# Patient Record
Sex: Male | Born: 1955 | Race: White | Hispanic: No | Marital: Single | Smoking: Never smoker
Health system: Southern US, Community
[De-identification: ages and names within clinical notes are randomized; demographics above are authoritative.]

## PROBLEM LIST (undated history)

## (undated) DIAGNOSIS — M1990 Unspecified osteoarthritis, unspecified site: Secondary | ICD-10-CM

## (undated) DIAGNOSIS — F209 Schizophrenia, unspecified: Secondary | ICD-10-CM

## (undated) DIAGNOSIS — E785 Hyperlipidemia, unspecified: Secondary | ICD-10-CM

## (undated) DIAGNOSIS — I10 Essential (primary) hypertension: Secondary | ICD-10-CM

## (undated) HISTORY — DX: Schizophrenia, unspecified: F20.9

## (undated) HISTORY — DX: Hyperlipidemia, unspecified: E78.5

## (undated) HISTORY — DX: Essential (primary) hypertension: I10

## (undated) HISTORY — DX: Unspecified osteoarthritis, unspecified site: M19.90

---

## 2005-01-06 ENCOUNTER — Ambulatory Visit (HOSPITAL_COMMUNITY): Payer: Self-pay | Admitting: Psychiatry

## 2005-05-30 ENCOUNTER — Ambulatory Visit (HOSPITAL_COMMUNITY): Payer: Self-pay | Admitting: Psychiatry

## 2005-11-23 ENCOUNTER — Ambulatory Visit (HOSPITAL_COMMUNITY): Payer: Self-pay | Admitting: Psychiatry

## 2006-05-08 ENCOUNTER — Ambulatory Visit (HOSPITAL_COMMUNITY): Payer: Self-pay | Admitting: Psychiatry

## 2006-11-08 ENCOUNTER — Ambulatory Visit (HOSPITAL_COMMUNITY): Payer: Self-pay | Admitting: Psychiatry

## 2007-05-07 ENCOUNTER — Ambulatory Visit (HOSPITAL_COMMUNITY): Payer: Self-pay | Admitting: Psychiatry

## 2007-11-02 ENCOUNTER — Ambulatory Visit (HOSPITAL_COMMUNITY): Payer: Self-pay | Admitting: Psychiatry

## 2008-03-31 ENCOUNTER — Ambulatory Visit (HOSPITAL_COMMUNITY): Payer: Self-pay | Admitting: Psychiatry

## 2008-09-22 ENCOUNTER — Ambulatory Visit (HOSPITAL_COMMUNITY): Payer: Self-pay | Admitting: Psychiatry

## 2009-03-16 ENCOUNTER — Ambulatory Visit (HOSPITAL_COMMUNITY): Payer: Self-pay | Admitting: Psychiatry

## 2009-09-07 ENCOUNTER — Ambulatory Visit (HOSPITAL_COMMUNITY): Payer: Self-pay | Admitting: Psychiatry

## 2009-11-09 ENCOUNTER — Ambulatory Visit (HOSPITAL_COMMUNITY): Payer: Self-pay | Admitting: Psychiatry

## 2010-02-01 ENCOUNTER — Ambulatory Visit (HOSPITAL_COMMUNITY): Payer: Self-pay | Admitting: Psychiatry

## 2010-05-03 ENCOUNTER — Encounter (HOSPITAL_COMMUNITY): Payer: Medicare Other | Admitting: Psychiatry

## 2010-05-03 DIAGNOSIS — F259 Schizoaffective disorder, unspecified: Secondary | ICD-10-CM

## 2010-06-15 NOTE — Group Therapy Note (Signed)
NAME:  Patrick Moreno, WOMBLES NO.:  [id number]   MEDICAL RECORD NO.:  [id number]           PATIENT TYPE:   LOCATION:                                 FACILITY:   PHYSICIAN:  Syed T. Arfeen, M.D.        DATE OF BIRTH:                                 PROGRESS NOTE   The patient came in today for his follow-up appointment in [place]  office.  He has been doing very well on his current medication.  He  reported his mood has been stable and he denies any agitation or anger.  He is somewhat concerned about the job and future economy.  He feels  sometimes job is stressful, but he has no problem at job.  He continues  to work at [employer]  He is doing 20-26 hours a week.  He feels his  job people like him.  He does recall in the past 6 months any episode of  hallucination.  However, he sometimes still feels paranoia.  He reported  that he has been sleeping okay and reported no side effects of  medication.  His mother is in [facility] nursing home in memory care  which he still visits frequently.  His sister is in [place] and  working as an Dentist and he also visits her very frequently.  He has been compliant his medication Risperdal 3 mg two at bedtime.  He  is scheduled to see Dr. Electa Sniff in [place] for blood work today.  He  is taking hydrochlorothiazide 12.5 mg, Lipitor 40 mg, metoprolol 100 mg,  quinapril 40 mg.  He remembers his last blood work was normal and is  hoping that his blood work today will be normal. The patient appears  calm, pleasant, cooperative.  He maintained a good eye contact.  He  denies any auditory hallucinations, suicidal thoughts or homicidal  thoughts.  There were no EPS or involuntary movements noted.  He feels  his current medicine is working.   ASSESSMENT:  Schizophrenia affective disorder.   PLAN:  We will continue the Risperdal 3 mg two at bedtime.  I explained risks  and benefits of the medication.  The patient is  scheduled to have blood  work today at Dr. Barrett Shell office who is his primary care doctor in  [place].  I requested to have the blood work results faxed to [name].  I  will see him again in six months.      Syed T. Lolly Mustache, M.D.  Electronically Signed     STA/MEDQ  D:  09/22/2008  T:  09/22/2008  Job:  [id number]

## 2010-08-02 ENCOUNTER — Encounter (INDEPENDENT_AMBULATORY_CARE_PROVIDER_SITE_OTHER): Payer: Medicare Other | Admitting: Psychiatry

## 2010-08-02 DIAGNOSIS — F259 Schizoaffective disorder, unspecified: Secondary | ICD-10-CM

## 2010-11-01 ENCOUNTER — Encounter (INDEPENDENT_AMBULATORY_CARE_PROVIDER_SITE_OTHER): Payer: Medicare Other | Admitting: Psychiatry

## 2010-11-01 DIAGNOSIS — F259 Schizoaffective disorder, unspecified: Secondary | ICD-10-CM

## 2011-02-07 ENCOUNTER — Encounter (HOSPITAL_COMMUNITY): Payer: Self-pay | Admitting: Psychiatry

## 2011-02-07 ENCOUNTER — Ambulatory Visit (INDEPENDENT_AMBULATORY_CARE_PROVIDER_SITE_OTHER): Payer: Medicare Other | Admitting: Psychiatry

## 2011-02-07 DIAGNOSIS — F209 Schizophrenia, unspecified: Secondary | ICD-10-CM

## 2011-02-07 MED ORDER — RISPERIDONE 3 MG PO TABS: 3.0000 mg | ORAL_TABLET | Freq: Two times a day (BID) | ORAL | Status: DC

## 2011-02-07 NOTE — Progress Notes (Signed)
Patient came for his followup appointment. Recently he has fell at job and has not been going to work however he is doing much better now. 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 He sleeping good and reported no hallucinations paranoid thinking agitation or anger. He is working at [employer] for along time. He works 22 hours a week and feels proud and motivated for his work. He has seen recently his primary care physician Dr. Electa Sniff has continued his medication for hyperlipidemia and hypertension. His recent blood work is also within normal limits. His TSH is 2.49 PSA is 0.41 glucose is 102. His liver function test and kidney functions are normal. He denies any tremors with Risperdal. He does not want to reduce his dose as she's been a very stable on his current medication.  Mental status examination Patient is calm cooperative and pleasant. His speech is slow but clear and coherent. His thought process is logical linear and goal-directed. He denies any auditory or visual hallucination. His thinking is slow but his thought process is logical linear and goal-directed. He denies any active or passive suicidal thinking and homicidal thinking. She is alert and oriented x3. There were no extrapyramidal side effects noted. He is alert and oriented x3. His insight judgment and pulse control is okay  Assessment Schizophrenia chronic paranoid type  Plan I will continue his Risperdal 3 mg 2 at bedtime. Patient has been tolerating his medication without any side effects. I recommended to call us if he has any question or concern about the medication or any time if he having worsening of her symptoms. I will see him again in 3 months

## 2011-05-02 ENCOUNTER — Encounter (HOSPITAL_COMMUNITY): Payer: Self-pay | Admitting: Psychiatry

## 2011-05-02 ENCOUNTER — Ambulatory Visit (INDEPENDENT_AMBULATORY_CARE_PROVIDER_SITE_OTHER): Payer: Medicare Other | Admitting: Psychiatry

## 2011-05-02 VITALS — BP 130/90 | HR 82 | Wt 203.8 lb

## 2011-05-02 DIAGNOSIS — F209 Schizophrenia, unspecified: Secondary | ICD-10-CM

## 2011-05-02 MED ORDER — RISPERIDONE 3 MG PO TABS: 3.0000 mg | ORAL_TABLET | Freq: Two times a day (BID) | ORAL | Status: DC

## 2011-05-02 NOTE — Progress Notes (Signed)
Chief complaint Medication management and followup.    History of presenting illness Patient is 56 year old single employed Caucasian male who came for his followup appointment.  Patient has been recently anxious and feeling overwhelmed .  His sister diagnosed with lymphoma and getting treatment at [facility] .  However he is hoping that she will recover completely as Dr. promise good chances of recovery .  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  He is been compliant with his medication and reported no side effects.  He has some residual paranoia but he denies any agitation anger or mood swings.  He denies any hallucination or any violent episodes.  He sleeps 6-7 hours.  He denies any tremors shakes or any side effects.  Current psychiatric medication Risperdal 3 mg twice a day  Past psychiatric history Patient has history of schizophrenia since 09-Jul-1989.  He had acute psychotic episode while he was working and [place].  He got disability due to her psychiatric illness.  He was diagnosed with schizophrenia at that time.  In the past he had tried her told and Stelazine however he has been stable on Risperdal for a long time.  Patient has a history of psychiatric inpatient treatment or any suicidal attempt.   Medical history Has history of hypertension and hyperlipidemia.  His primary care physician is  Dr. Electa Sniff.  He is scheduled to see him in August for complete and I will check .  He is compliant with his blood pressure and cholesterol medication .  Psychosocial history Patient was born in [place] and then he move to [place] where he did his schooling and college education .  He had a masters in [employer] .  He never married and has no children .  He moved to [place] in 1989-07-09 due to his psychiatric illness live close to his parents.  In July 10, 2003 his father died and he decided to move [facility] to live close to his sister.  Patient lives by himself however he  has a girlfriend .  He reported his relationship is going very well.  He is working 22 hours at [employer] .   Alcohol and substance use history Patient has a history of alcohol or any illegal substance use.  Mental status examination Patient is casually dressed and fairly groomed.  He is calm cooperative and pleasant. His speech is slow but clear and coherent. His thought process is logical linear and goal-directed. He denies any auditory or visual hallucination. His thinking is slow but his thought process is logical linear and goal-directed. He denies any active or passive suicidal thinking and homicidal thinking. She is alert and oriented x3. There were no extrapyramidal side effects noted. He is alert and oriented x3. His insight judgment and pulse control is okay  Assessment Axis I Schizophrenia chronic paranoid type Axis II deferred Axis III hypertension and hyperlipidemia Axis IV mild to moderate Axis V 55-60  Plan I  reviewed psychosocial stressors, medication side effects, previous progress notes and last blood results .  Reassurance given .  I recommended to continue his Risperdal 3 mg twice a day.  At this time patient reported no side effects of medication.  Denies any tremors shakes or any extrapyramidal side effects.  I offered counseling for increase coping and social skills however patient denied .  I recommended to call us if he feel worsening of symptoms including hallucination or paranoid thinking.  I will see him again in  3 months. Time spent 30 minutes

## 2011-07-29 ENCOUNTER — Ambulatory Visit (INDEPENDENT_AMBULATORY_CARE_PROVIDER_SITE_OTHER): Payer: Medicare Other | Admitting: Psychiatry

## 2011-07-29 ENCOUNTER — Encounter (HOSPITAL_COMMUNITY): Payer: Self-pay | Admitting: Psychiatry

## 2011-07-29 DIAGNOSIS — F200 Paranoid schizophrenia: Secondary | ICD-10-CM

## 2011-07-29 DIAGNOSIS — F209 Schizophrenia, unspecified: Secondary | ICD-10-CM

## 2011-07-29 MED ORDER — RISPERIDONE 3 MG PO TABS: 3.0000 mg | ORAL_TABLET | Freq: Two times a day (BID) | ORAL | Status: DC

## 2011-07-29 NOTE — Progress Notes (Signed)
Chief complaint Medication management and followup.    History of presenting illness Patient is 56 year old single employed Caucasian male who came for his followup appointment.  Patient has been compliant with his medication and reported no side effects.  Recently he is taking care of his sister dog who has been admitted for cancer treatment.  Her sister was diagnosed with cancer in January.  Patient endorse anxious and feeling overwhelmed about her sister but hoping that she will get better.   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  He feels sometimes tired but denies any insomnia agitation anger mood swing.  His paranoia has been better with the medication.  He has no tremors or shakes.  His thinking is clear and organized.  He's not drinking or using any illegal substance.  Current psychiatric medication Risperdal 3 mg twice a day  Past psychiatric history Patient has history of schizophrenia since July 27, 1989.  He had acute psychotic episode while he was working and [place].  He got disability due to her psychiatric illness.  He was diagnosed with schizophrenia at that time.  In the past he had tried her told and Stelazine however he has been stable on Risperdal for a long time.  Patient has a history of psychiatric inpatient treatment or any suicidal attempt.   Medical history Has history of hypertension and hyperlipidemia.  His primary care physician is  Dr. Electa Sniff.  He is scheduled to see him in August for complete and annual check .  He is compliant with his blood pressure and cholesterol medication .  Psychosocial history Patient was born in [place] and then he move to [place] where he did his schooling and college education .  He had a masters in [employer] .  He never married and has no children .  He moved to [place] in 1989/07/27 due to his psychiatric illness live close to his parents.  In 07/28/2003 his father died and he decided to move [facility] to live  close to his sister.  Patient lives by himself however he has a girlfriend .  He reported his relationship is going very well.  He is working 22 hours at [employer] .   Alcohol and substance use history Patient has a history of alcohol or any illegal substance use.  Mental status examination Patient is casually dressed and fairly groomed.  He is calm cooperative and pleasant. His speech is slow but clear and coherent. His thought process is logical linear and goal-directed. He denies any auditory or visual hallucination. His thinking is slow but his thought process is logical linear and goal-directed. He denies any active or passive suicidal thinking and homicidal thinking. She is alert and oriented x3. There were no extrapyramidal side effects noted. He is alert and oriented x3. His insight judgment and pulse control is okay  Assessment Axis I Schizophrenia chronic paranoid type Axis II deferred Axis III hypertension and hyperlipidemia Axis IV mild to moderate Axis V 55-60  Plan I will continue his current psychiatric medication.  Patient do not have any side effects of medication at this time.  Patient is scheduled to see his primary care physician and few weeks.  I offered counseling for increase coping and social skills however patient denied .  I recommended to call us if he has any question about medication or if he feel worsening of symptoms.  I will see him again in 3 months.  Portion of this note is generated  with voice recognition software and may contain typographical error.

## 2011-08-01 ENCOUNTER — Ambulatory Visit (HOSPITAL_COMMUNITY): Payer: Medicare Other | Admitting: Psychiatry

## 2011-10-31 ENCOUNTER — Ambulatory Visit (INDEPENDENT_AMBULATORY_CARE_PROVIDER_SITE_OTHER): Payer: Medicare Other | Admitting: Psychiatry

## 2011-10-31 ENCOUNTER — Encounter (HOSPITAL_COMMUNITY): Payer: Self-pay | Admitting: Psychiatry

## 2011-10-31 VITALS — BP 145/83 | HR 70 | Wt 197.0 lb

## 2011-10-31 DIAGNOSIS — F200 Paranoid schizophrenia: Secondary | ICD-10-CM

## 2011-10-31 DIAGNOSIS — F209 Schizophrenia, unspecified: Secondary | ICD-10-CM

## 2011-10-31 MED ORDER — RISPERIDONE 3 MG PO TABS: 3.0000 mg | ORAL_TABLET | Freq: Two times a day (BID) | ORAL | Status: DC

## 2011-10-31 NOTE — Progress Notes (Signed)
Chief complaint Medication management and followup.    History of presenting illness Patient came for his followup appointment.  He is compliant with the medication.  His sister recently finished cancer treatment from [facility].  As per patient sister is doing fairly well.  Patient is sleeping better now.  He is working 22 offers and [employer].  Patient denies any recent agitation anger mood swing.  He is thinking to start walking as her recent blood work shows mild elevation of blood sugar.  He has lost some weight from the past.  Patient denies any paranoia or any recent hallucination.  He likes his current psychiatric medication.  He has no tremors or shakes.  His thinking is clear and organized.  He's not drinking or using any illegal substance.  He is sleeping better.  He brought recent blood work which was done in July 2013.  His blood results are normal except mild elevation of glucose.    Current psychiatric medication Risperdal 3 mg twice a day  Past psychiatric history Patient has history of schizophrenia since Jul 07, 1989.  He had acute psychotic episode while he was working in [place].  In the past he had tried Stelazine however he has been stable on Risperdal for a long time.  Patient denies any history of psychiatric inpatient treatment or any suicidal attempt.   Medical history Patient has history of hypertension and hyperlipidemia.  His primary care physician is  Dr. Doristine Counter at [facility] [place].  His last blood work was done on 08/15/2011.  His CBC is normal, his PSA is 0.4, Vistaril cholesterol is 178, LDL 105, his comprehensive metabolic panel was normal however he has glucose 109.  Psychosocial history Patient was born in [place] and then he move to [place] where he did his schooling and college education .  He had a masters in [employer] .  He never married and has no children .  He moved to [place] in July 07, 1989 due to his  psychiatric illness live close to his parents.  In Jul 08, 2003 his father died and he decided to move [facility] to live close to his sister.  Patient lives by himself however he has a girlfriend .  He reported his relationship is going very well.  He is working 22 hours at [employer] .   Alcohol and substance use history Patient denies any history of alcohol or any illegal substance use.  Mental status examination Patient is casually dressed and fairly groomed.  He is calm cooperative and pleasant. His speech is slow but clear and coherent. His thought process is logical linear and goal-directed. He denies any auditory or visual hallucination. His thinking is slow but his thought process is logical linear and goal-directed. He denies any active or passive suicidal thinking and homicidal thinking. She is alert and oriented x3. There were no extrapyramidal side effects noted. He is alert and oriented x3. His insight judgment and pulse control is okay  Assessment Axis I Schizophrenia chronic paranoid type Axis II deferred Axis III hypertension and hyperlipidemia Axis IV mild to moderate Axis V 55-60  Plan I review his blood work , last progress note and response to the medication.  She is fairly stable on his Risperdal.  He does not want to change or reduce his dose.  I will continue his current psychiatric medication.  I update his medical history.  He is taking hydrochlorothiazide for his blood pressure.  I encourage him for regular walking  and watching his calorie intake .  He has able to lose some weight.  I recommend to call us if he is any question or concern about the medication if he feel worsening of the symptom.  I will see him again in 3 months.  Time spent 30 minutes.  Portion of this note is generated with voice recognition software and may contain typographical error.

## 2012-02-06 ENCOUNTER — Ambulatory Visit (HOSPITAL_COMMUNITY): Payer: Medicare Other | Admitting: Psychiatry

## 2012-02-13 ENCOUNTER — Ambulatory Visit (HOSPITAL_COMMUNITY): Payer: Medicare Other | Admitting: Psychiatry

## 2012-02-14 ENCOUNTER — Encounter (HOSPITAL_COMMUNITY): Payer: Self-pay | Admitting: Psychiatry

## 2012-02-14 ENCOUNTER — Ambulatory Visit (INDEPENDENT_AMBULATORY_CARE_PROVIDER_SITE_OTHER): Payer: Medicare Other | Admitting: Psychiatry

## 2012-02-14 VITALS — Wt 204.0 lb

## 2012-02-14 DIAGNOSIS — F209 Schizophrenia, unspecified: Secondary | ICD-10-CM

## 2012-02-14 DIAGNOSIS — F200 Paranoid schizophrenia: Secondary | ICD-10-CM

## 2012-02-14 MED ORDER — RISPERIDONE 3 MG PO TABS: 3.0000 mg | ORAL_TABLET | Freq: Two times a day (BID) | ORAL | Status: DC

## 2012-02-14 NOTE — Progress Notes (Signed)
Chief complaint Medication management and followup.    History of presenting illness Patient came for his followup appointment.  He is compliant with the medication.  He has a good Christmas with his nephew and sister.  He denies any side effects of medication.  His thinking is clear and organized however he admitted some time he get very upset while working and he see other people having hard time paying their food and groceries.  He denies any recent irritability agitation anger mood swing.  He still has some paranoia but he denies any auditory or visual hallucination.  He sleeping 7-8 hours.  He enjoyed his work.  He's not drinking or using any illegal substance.  He is trying to cut down carbohydrate since he was told that he has borderline diabetes.  Current psychiatric medication Risperdal 3 mg twice a day  Past psychiatric history Patient has history of schizophrenia since Jun 11, 1989.  He had acute psychotic episode while he was working in [place].  In the past he had tried Stelazine however he has been stable on Risperdal for a long time.  Patient denies any history of psychiatric inpatient treatment or any suicidal attempt.   Medical history Patient has history of hypertension and hyperlipidemia.  His primary care physician is  Dr. Doristine Counter at [facility] [place].  His last blood work was done on 08/15/2011.  His CBC is normal, his PSA is 0.4, Vistaril cholesterol is 178, LDL 105, his comprehensive metabolic panel was normal however he has glucose 109.  Psychosocial history Patient was born in [place] and then he move to [place] where he did his schooling and college education .  He had a masters in [employer] .  He never married and has no children .  He moved to [place] in Jun 11, 1989 due to his psychiatric illness live close to his parents.  In 06-12-2003 his father died and he decided to move [facility] to live close to his sister.  Patient lives by himself  however he has a girlfriend .  He reported his relationship is going very well.  He is working 20 hours at [employer] .   Alcohol and substance use history Patient denies any history of alcohol or any illegal substance use.  Review of Systems  Cardiovascular: Positive for palpitations.  Musculoskeletal: Negative.   Psychiatric/Behavioral: The patient is nervous/anxious.    Mental status examination Patient is casually dressed and fairly groomed.  He is calm cooperative and pleasant. His speech is slow but clear and coherent. His thought process is logical linear and goal-directed. He denies any auditory or visual hallucination. His thinking is slow but his thought process is logical linear and goal-directed. He denies any active or passive suicidal thinking and homicidal thinking. She is alert and oriented x3. There were no extrapyramidal side effects noted. He is alert and oriented x3. His insight judgment and pulse control is okay  Assessment Axis I Schizophrenia chronic paranoid type Axis II deferred Axis III hypertension and hyperlipidemia Axis IV mild to moderate Axis V 55-60  Plan I will continue his current psychiatric medication.  He denies any tremors or shakes.  Continue to encourage her watching his diet, calorie and recommended to do exercise.  I will see him again in 3 months.  Recommend to call us if he is any question or concern if he feel worsening of the symptoms.  Portion of this note is generated with voice recognition software and may contain typographical  error.

## 2012-02-20 ENCOUNTER — Ambulatory Visit (HOSPITAL_COMMUNITY): Payer: Medicare Other | Admitting: Psychiatry

## 2012-05-07 ENCOUNTER — Encounter (HOSPITAL_COMMUNITY): Payer: Self-pay | Admitting: Psychiatry

## 2012-05-07 ENCOUNTER — Ambulatory Visit (INDEPENDENT_AMBULATORY_CARE_PROVIDER_SITE_OTHER): Payer: No Typology Code available for payment source | Admitting: Psychiatry

## 2012-05-07 VITALS — BP 139/89 | HR 72 | Wt 202.0 lb

## 2012-05-07 DIAGNOSIS — F209 Schizophrenia, unspecified: Secondary | ICD-10-CM

## 2012-05-07 DIAGNOSIS — E785 Hyperlipidemia, unspecified: Secondary | ICD-10-CM | POA: Insufficient documentation

## 2012-05-07 DIAGNOSIS — F200 Paranoid schizophrenia: Secondary | ICD-10-CM

## 2012-05-07 DIAGNOSIS — I10 Essential (primary) hypertension: Secondary | ICD-10-CM

## 2012-05-07 MED ORDER — RISPERIDONE 3 MG PO TABS: 3.0000 mg | ORAL_TABLET | Freq: Two times a day (BID) | ORAL | Status: DC

## 2012-05-07 NOTE — Progress Notes (Signed)
[facility] [id number] Progress Note  Patrick Moreno [id number] 57 y.o.  05/07/2012 10:00 AM  Chief Complaint: Followup and medication management.  History of Present Illness: Patient is a 57 year old Caucasian male who came for his followup appointment.  He is compliant with his Risperdal.  He denies any side effects.  He is working 20 hours and has been very busy.  He likes his job.  Recently his supervisor is changed and he is very happy with new supervisor.  He is sleeping better.  He denies any paranoia, hallucination or any irritability.  He is not drinking or using any illegal substance.  He is relief that his sister is doing better and now she is back to full-time working.  Suicidal Ideation: No Plan Formed: No Patient has means to carry out plan: No  Homicidal Ideation: No Plan Formed: No Patient has means to carry out plan: No  Review of Systems: Psychiatric: Agitation: No Hallucination: No Depressed Mood: No Insomnia: No Hypersomnia: No Altered Concentration: No Feels Worthless: No Grandiose Ideas: No Belief In Special Powers: No New/Increased Substance Abuse: No Compulsions: No  Neurologic: Headache: No Seizure: No Paresthesias: No  Medical History: Patient has a history of hypertension, hyperlipidemia and obesity.  He sees Dr. Doristine Counter.  He is scheduled to see him this July.  His last blood work which was done in July 2013 was normal.  Psychosocial history. Patient was born and raised in [place].  Then he moved to [place] where he did schooling and college education.  Patient has a Scientist, water quality in [employer].  Patient has never married and he has no children.  He moved to [place] in 1991 to live close to his parents.  Patient lives by himself however he has a girlfriend.  Patient works 30 hours at [employer].  Alcohol and substance use history. Patient denies any history of alcohol or any illegal substances.  Outpatient Encounter  Prescriptions as of 05/07/2012  Medication Sig Dispense Refill  . atorvastatin (LIPITOR) 40 MG tablet Take 40 mg by mouth daily.        . hydrochlorothiazide (HYDRODIURIL) 12.5 MG tablet Take 12.5 mg by mouth daily.      . metoprolol (LOPRESSOR) 100 MG tablet Take 100 mg by mouth 2 (two) times daily.        . quinapril (ACCUPRIL) 40 MG tablet Take 40 mg by mouth at bedtime.        . risperiDONE (RISPERDAL) 3 MG tablet Take 1 tablet (3 mg total) by mouth 2 (two) times daily.  60 tablet  2  . [DISCONTINUED] risperiDONE (RISPERDAL) 3 MG tablet Take 1 tablet (3 mg total) by mouth 2 (two) times daily.  60 tablet  2   No facility-administered encounter medications on file as of 05/07/2012.    Past Psychiatric History/Hospitalization(s): Patient has been diagnosed with schizophrenia since 57.  He had a psychotic episode when he was working in [place].  Patient denies any previous history of psychiatric admission treatment or any suicidal plan.  In the past he has taken Stelazine.  He's been doing very well on Risperdal.  Anxiety: No Bipolar Disorder: No Depression: No Mania: No Psychosis: Yes Schizophrenia: Yes Personality Disorder: No Hospitalization for psychiatric illness: No History of Electroconvulsive Shock Therapy: No Prior Suicide Attempts: No  Physical Exam: Constitutional:  BP 139/89  Pulse 72  Wt 202 lb (91.627 kg)  General Appearance: alert, oriented, no acute distress and well nourished  Musculoskeletal: Strength & Muscle Tone:  within normal limits Gait & Station: normal Patient leans: N/A  Psychiatric: Speech (describe rate, volume, coherence, spontaneity, and abnormalities if any): Didn't coherent  Thought Process (describe rate, content, abstract reasoning, and computation): Logical and goal directed  Associations: Intact  Thoughts: normal and No paranoia or delusions  Mental Status: Orientation: oriented to person and situation Mood & Affect:  anxiety Attention Span & Concentration: Good  Medical Decision Making (Choose Three): Established Problem, Stable/Improving (1), Review of Last Therapy Session (1) and Review of Medication Regimen & Side Effects (2)  Assessment: Axis I: Schizophrenia chronic paranoid type  Axis II:  deferred  Axis III:  Patient Active Problem List  Diagnosis  . HTN (hypertension)  . Hyperlipidemia    Axis IV: Mild  Axis V: 60-65   Plan: I will continue his Risperdal 3 mg twice a day.  Patient does not have any side effects including any tremors or shakes.  He is scheduled to see his primary care physician in July for annual physical and blood work.  I recommend to call us back if he has any questions or concerns or if he feels worsening of the symptoms.  I will see him again in 3 months.  Patrick Moreno T., MD 05/07/2012

## 2012-05-14 ENCOUNTER — Ambulatory Visit (HOSPITAL_COMMUNITY): Payer: Self-pay | Admitting: Psychiatry

## 2012-08-06 ENCOUNTER — Encounter (HOSPITAL_COMMUNITY): Payer: Self-pay | Admitting: Psychiatry

## 2012-08-06 ENCOUNTER — Ambulatory Visit (INDEPENDENT_AMBULATORY_CARE_PROVIDER_SITE_OTHER): Payer: Medicare Other | Admitting: Psychiatry

## 2012-08-06 VITALS — BP 156/94 | HR 63 | Wt 199.0 lb

## 2012-08-06 DIAGNOSIS — F209 Schizophrenia, unspecified: Secondary | ICD-10-CM

## 2012-08-06 DIAGNOSIS — F200 Paranoid schizophrenia: Secondary | ICD-10-CM

## 2012-08-06 MED ORDER — RISPERIDONE 3 MG PO TABS: 3.0000 mg | ORAL_TABLET | Freq: Two times a day (BID) | ORAL | Status: DC

## 2012-08-06 NOTE — Progress Notes (Signed)
[facility] [id number] Progress Note  Patrick Moreno [id number] 57 y.o.  08/06/2012 1:48 PM  Chief Complaint: Followup and medication management.  History of Present Illness: Patient is a 57 year old Caucasian male who came for his followup appointment.  He is compliant with his Risperdal.  He denies any side effects.  He went to [place] with his niece and sister to attend the wedding.  He had a good time.  He likes his job.  He is working 20 hours and has been very busy.  He likes his job.  He denies any agitation anger or any mood swing.  He denies any paranoia hallucination or any crying spells .  He has any tremors shakes or any muscle stiffness.  He is now drinking or using any illegal substance.  Today his blood pressure is slightly increased however he reported that he was rushing for his appointment.  Suicidal Ideation: No Plan Formed: No Patient has means to carry out plan: No  Homicidal Ideation: No Plan Formed: No Patient has means to carry out plan: No  Review of Systems: Psychiatric: Agitation: No Hallucination: No Depressed Mood: No Insomnia: No Hypersomnia: No Altered Concentration: No Feels Worthless: No Grandiose Ideas: No Belief In Special Powers: No New/Increased Substance Abuse: No Compulsions: No  Neurologic: Headache: No Seizure: No Paresthesias: No  Medical History: Patient has a history of hypertension, hyperlipidemia and obesity.  He sees Dr. Doristine Counter.  He is scheduled to see him this July.  His last blood work which was done in July 2013 was normal.  Psychosocial history. Patient was born and raised in [place].  Then he moved to [place] where he did schooling and college education.  Patient has a Scientist, water quality in [employer].  Patient has never married and he has no children.  He moved to [place] in 1991 to live close to his parents.  Patient lives by himself however he has a girlfriend.  Patient works 30 hours at [employer].  Alcohol and substance use history. Patient denies any history of alcohol or any illegal substances.  Outpatient Encounter Prescriptions as of 08/06/2012  Medication Sig Dispense Refill  . atorvastatin (LIPITOR) 40 MG tablet Take 40 mg by mouth daily.        . hydrochlorothiazide (HYDRODIURIL) 12.5 MG tablet Take 12.5 mg by mouth daily.      . metoprolol (LOPRESSOR) 100 MG tablet Take 100 mg by mouth 2 (two) times daily.        . quinapril (ACCUPRIL) 40 MG tablet Take 40 mg by mouth at bedtime.        . risperiDONE (RISPERDAL) 3 MG tablet Take 1 tablet (3 mg total) by mouth 2 (two) times daily.  60 tablet  2  . [DISCONTINUED] risperiDONE (RISPERDAL) 3 MG tablet Take 1 tablet (3 mg total) by mouth 2 (two) times daily.  60 tablet  2   No facility-administered encounter medications on file as of 08/06/2012.    Past Psychiatric History/Hospitalization(s): Patient has been diagnosed with schizophrenia since 34.  He had a psychotic episode when he was working in [place].  Patient denies any previous history of psychiatric admission treatment or any suicidal plan.  In the past he has taken Stelazine.  He's been doing very well on Risperdal.  Anxiety: No Bipolar Disorder: No Depression: No Mania: No Psychosis: Yes Schizophrenia: Yes Personality Disorder: No Hospitalization for psychiatric illness: No History of Electroconvulsive Shock Therapy: No Prior Suicide Attempts: No  Physical Exam: Constitutional:  BP 156/94  Pulse 63  Wt 199 lb (90.266 kg)  General Appearance: alert, oriented, no acute distress and well nourished  Musculoskeletal: Strength & Muscle Tone: within normal limits Gait & Station: normal Patient leans: N/A  Psychiatric: Speech (describe rate, volume, coherence, spontaneity, and abnormalities if any): Didn't coherent  Thought Process (describe rate, content, abstract reasoning, and computation): Logical and goal directed  Associations:  Intact  Thoughts: normal and No paranoia or delusions  Mental Status: Orientation: oriented to person and situation Mood & Affect: anxiety Attention Span & Concentration: Good  Medical Decision Making (Choose Three): Established Problem, Stable/Improving (1), Review of Last Therapy Session (1) and Review of Medication Regimen & Side Effects (2)  Assessment: Axis I: Schizophrenia chronic paranoid type  Axis II:  deferred  Axis III:  Patient Active Problem List   Diagnosis Date Noted  . HTN (hypertension) 05/07/2012  . Hyperlipidemia 05/07/2012    Axis IV: Mild  Axis V: 60-65   Plan: I recommend to followup with his primary care physician if he continues to have high blood pressure reading.  Recommend to monitor his blood pressure at local pharmacy periodically.  I will continue Vistaril 20 mg twice a day.  Discuss side effects and benefits.  Recommend to call us back it is a question of conservatively worsening of the symptom.  Patient has appointment with his primary care physician in 3 weeks.  I will see him in 3 months. ARFEEN,SYED T., MD 08/06/2012

## 2012-11-12 ENCOUNTER — Ambulatory Visit (INDEPENDENT_AMBULATORY_CARE_PROVIDER_SITE_OTHER): Payer: Medicare Other | Admitting: Psychiatry

## 2012-11-12 ENCOUNTER — Encounter (HOSPITAL_COMMUNITY): Payer: Self-pay | Admitting: Psychiatry

## 2012-11-12 ENCOUNTER — Encounter (INDEPENDENT_AMBULATORY_CARE_PROVIDER_SITE_OTHER): Payer: Self-pay

## 2012-11-12 VITALS — BP 118/70 | HR 75 | Ht 66.93 in | Wt 196.2 lb

## 2012-11-12 DIAGNOSIS — F209 Schizophrenia, unspecified: Secondary | ICD-10-CM

## 2012-11-12 DIAGNOSIS — F200 Paranoid schizophrenia: Secondary | ICD-10-CM

## 2012-11-12 MED ORDER — RISPERIDONE 3 MG PO TABS: 3.0000 mg | ORAL_TABLET | Freq: Two times a day (BID) | ORAL | Status: DC

## 2012-11-12 NOTE — Progress Notes (Signed)
[facility] [id number] Progress Note  Patrick Moreno [id number] 57 y.o.  11/12/2012 1:27 PM  Chief Complaint: Followup and medication management.  History of Present Illness: Patient is a 57 year old Caucasian male who came for his followup appointment.  He is compliant with his Risperdal.  He denies any side effects.  He has blood work on July 28 , shows normal CBC, basic chemistry and normal liver enzymes.  Her lipid panel was also normal.  Blood work was done by his primary care physician Dr. Doristine Counter .  There has been no change in his medication.  He has lost weight from the past.  He is trying to lose weight.  He is active and enjoy his job.  Patient denies irritability, anger, poor sleep, paranoia or any hallucination.  He has no tremors or shakes.  He continued to work 20 hours .  He wants to continue his Risperdal.  He is not drinking or using any illegal substance.  Suicidal Ideation: No Plan Formed: No Patient has means to carry out plan: No  Homicidal Ideation: No Plan Formed: No Patient has means to carry out plan: No  Review of Systems: Psychiatric: Agitation: No Hallucination: No Depressed Mood: No Insomnia: No Hypersomnia: No Altered Concentration: No Feels Worthless: No Grandiose Ideas: No Belief In Special Powers: No New/Increased Substance Abuse: No Compulsions: No  Neurologic: Headache: No Seizure: No Paresthesias: No  Medical History:  Patient has a history of hypertension, hyperlipidemia and obesity.  He sees Dr. Doristine Counter.  His last blood work which was done in July 2014 was normal.  Psychosocial history. Patient was born and raised in [place].  Then he moved to [place] where he did schooling and college education.  Patient has a Scientist, water quality in [employer].  Patient has never married and he has no children.  He moved to [place] in 1991 to live close to his parents.  Patient lives by himself however he has a girlfriend.  Patient  works 30 hours at [employer].  Alcohol and substance use history. Patient denies any history of alcohol or any illegal substances.  Outpatient Encounter Prescriptions as of 11/12/2012  Medication Sig Dispense Refill  . atorvastatin (LIPITOR) 40 MG tablet Take 40 mg by mouth daily.        . hydrochlorothiazide (HYDRODIURIL) 12.5 MG tablet Take 12.5 mg by mouth daily.      . metoprolol (LOPRESSOR) 100 MG tablet Take 100 mg by mouth 2 (two) times daily.        . quinapril (ACCUPRIL) 40 MG tablet Take 40 mg by mouth at bedtime.        . risperiDONE (RISPERDAL) 3 MG tablet Take 1 tablet (3 mg total) by mouth 2 (two) times daily.  60 tablet  2  . [DISCONTINUED] risperiDONE (RISPERDAL) 3 MG tablet Take 1 tablet (3 mg total) by mouth 2 (two) times daily.  60 tablet  2   No facility-administered encounter medications on file as of 11/12/2012.    Past Psychiatric History/Hospitalization(s): Patient has been diagnosed with schizophrenia since 75.  He had a psychotic episode when he was working in [place].  Patient denies any previous history of psychiatric admission treatment or any suicidal plan.  In the past he has taken Stelazine.  He's been doing very well on Risperdal.  Anxiety: No Bipolar Disorder: No Depression: No Mania: No Psychosis: Yes Schizophrenia: Yes Personality Disorder: No Hospitalization for psychiatric illness: No History of Electroconvulsive Shock Therapy: No Prior Suicide Attempts: No  Physical Exam: Constitutional:  BP 118/70  Pulse 75  Ht 5' 6.93" (1.7 m)  Wt 196 lb 3.2 oz (88.996 kg)  BMI 30.79 kg/m2  General Appearance: alert, oriented, no acute distress and well nourished  Musculoskeletal: Strength & Muscle Tone: within normal limits Gait & Station: normal Patient leans: N/A  Psychiatric: Speech (describe rate, volume, coherence, spontaneity, and abnormalities if any): Didn't coherent  Thought Process (describe rate, content, abstract  reasoning, and computation): Logical and goal directed  Associations: Intact  Thoughts: normal and No paranoia or delusions  Mental Status: Orientation: oriented to person and situation Mood & Affect: anxiety Attention Span & Concentration: Good  Medical Decision Making (Choose Three): Established Problem, Stable/Improving (1), Review or order clinical lab tests (1), Review of Last Therapy Session (1) and Review of Medication Regimen & Side Effects (2)  Assessment: Axis I: Schizophrenia chronic paranoid type  Axis II:  deferred  Axis III:  Patient Active Problem List   Diagnosis Date Noted  . HTN (hypertension) 05/07/2012  . Hyperlipidemia 05/07/2012    Axis IV: Mild  Axis V: 60-65   Plan: I review his blood work which is normal.  I'll continue his current Risperdal 2 mg twice a day.  Recommended to call us back if he is a question of any concern.  Followup in 3 months.   Lyris Hitchman T., MD 11/12/2012

## 2013-02-11 ENCOUNTER — Encounter (HOSPITAL_COMMUNITY): Payer: Self-pay | Admitting: Psychiatry

## 2013-02-11 ENCOUNTER — Ambulatory Visit (INDEPENDENT_AMBULATORY_CARE_PROVIDER_SITE_OTHER): Payer: Medicare Other | Admitting: Psychiatry

## 2013-02-11 ENCOUNTER — Encounter (INDEPENDENT_AMBULATORY_CARE_PROVIDER_SITE_OTHER): Payer: Self-pay

## 2013-02-11 VITALS — BP 160/80 | HR 89 | Ht 66.93 in | Wt 199.8 lb

## 2013-02-11 DIAGNOSIS — F209 Schizophrenia, unspecified: Secondary | ICD-10-CM

## 2013-02-11 DIAGNOSIS — F200 Paranoid schizophrenia: Secondary | ICD-10-CM

## 2013-02-11 MED ORDER — RISPERIDONE 3 MG PO TABS: 3.0000 mg | ORAL_TABLET | Freq: Two times a day (BID) | ORAL | Status: DC

## 2013-02-11 NOTE — Progress Notes (Signed)
[facility] [id number] Progress Note  Patrick AntiguaJohn Moreno [id number] 58 y.o.  02/11/2013 10:49 AM  Chief Complaint: Followup and medication management.  History of Present Illness: Patrick RuizJohn came for her followup appointment.  His compliance with Risperdal and denies any side effects.  He quit his work at [employer] because it was too strenuous and now he is looking for another job.  He is not working there since November.  He applied to multiple places and he is confident that he was able to get a job in [employer].  He had a good Christmas .  He spent time but his sister was cancer relapsed.  He is more busy with his sister in getting her to the doctor appointments.  Overall he is sleeping good.  He denies any irritability, anger, paranoia or any hallucination.  He likes his psychotropic medication.  He is not drinking or using any illegal substances.  His sleep and appetite is unchanged from the past.  He denies any crying spells or any anhedonia.  Suicidal Ideation: No Plan Formed: No Patient has means to carry out plan: No  Homicidal Ideation: No Plan Formed: No Patient has means to carry out plan: No  Review of Systems: Psychiatric: Agitation: No Hallucination: No Depressed Mood: No Insomnia: No Hypersomnia: No Altered Concentration: No Feels Worthless: No Grandiose Ideas: No Belief In Special Powers: No New/Increased Substance Abuse: No Compulsions: No  Neurologic: Headache: No Seizure: No Paresthesias: No  Medical History:  Patient has a history of hypertension, hyperlipidemia and obesity.  He sees [name].  His last blood work which was done in July 2014 was normal.   Outpatient Encounter Prescriptions as of 02/11/2013  Medication Sig  . atorvastatin (LIPITOR) 40 MG tablet Take 40 mg by mouth daily.    . hydrochlorothiazide (HYDRODIURIL) 12.5 MG tablet Take 12.5 mg by mouth daily.  . metoprolol (LOPRESSOR) 100 MG tablet Take 100 mg by mouth 2 (two) times daily.     . quinapril (ACCUPRIL) 40 MG tablet Take 40 mg by mouth at bedtime.    . risperiDONE (RISPERDAL) 3 MG tablet Take 1 tablet (3 mg total) by mouth 2 (two) times daily.  . [DISCONTINUED] risperiDONE (RISPERDAL) 3 MG tablet Take 1 tablet (3 mg total) by mouth 2 (two) times daily.    Past Psychiatric History/Hospitalization(s): Patient has been diagnosed with schizophrenia since [id number].  He had a psychotic episode when he was working in [place].  Patient denies any previous history of psychiatric admission treatment or any suicidal plan.  In the past he has taken Stelazine.  He's been doing very well on Risperdal.  Anxiety: No Bipolar Disorder: No Depression: No Mania: No Psychosis: Yes Schizophrenia: Yes Personality Disorder: No Hospitalization for psychiatric illness: No History of Electroconvulsive Shock Therapy: No Prior Suicide Attempts: No  Physical Exam: Constitutional:  BP 160/80  Pulse 89  Ht 5' 6.93" (1.7 m)  Wt 199 lb 12.8 oz (90.629 kg)  BMI 31.36 kg/m2  General Appearance: alert, oriented, no acute distress and well nourished  Musculoskeletal: Strength & Muscle Tone: within normal limits Gait & Station: normal Patient leans: N/A  Psychiatric: Speech (describe rate, volume, coherence, spontaneity, and abnormalities if any): Clear and coherent .    Thought Process (describe rate, content, abstract reasoning, and computation): Logical and goal directed  Associations: Intact  Thoughts: normal and No paranoia or delusions  Mental Status: Orientation: oriented to person and situation Mood & Affect: anxiety Attention Span & Concentration: Good  Medical  Decision Making (Choose Three): Established Problem, Stable/Improving (1), Review of Last Therapy Session (1) and Review of Medication Regimen & Side Effects (2)  Assessment: Axis I: Schizophrenia chronic paranoid type  Axis II:  deferred  Axis III:  Patient Active Problem List   Diagnosis Date Noted  .  HTN (hypertension) 05/07/2012  . Hyperlipidemia 05/07/2012    Axis IV: Mild  Axis V: 60-65   Plan: Patient is stable on Risperdal 3 mg twice a day.  Recommended to continue Risperdal.  Followup in 3 months unless needed to be seen before.   ARFEEN,SYED T., MD 02/11/2013

## 2013-05-13 ENCOUNTER — Encounter (HOSPITAL_COMMUNITY): Payer: Self-pay | Admitting: Psychiatry

## 2013-05-13 ENCOUNTER — Ambulatory Visit (INDEPENDENT_AMBULATORY_CARE_PROVIDER_SITE_OTHER): Payer: Medicare Other | Admitting: Psychiatry

## 2013-05-13 VITALS — Wt 201.0 lb

## 2013-05-13 DIAGNOSIS — F209 Schizophrenia, unspecified: Secondary | ICD-10-CM

## 2013-05-13 DIAGNOSIS — F200 Paranoid schizophrenia: Secondary | ICD-10-CM

## 2013-05-13 MED ORDER — RISPERIDONE 3 MG PO TABS: 3.0000 mg | ORAL_TABLET | Freq: Two times a day (BID) | ORAL | Status: DC

## 2013-05-13 NOTE — Progress Notes (Signed)
[facility] [id number] Progress Note  Patrick Moreno [id number] 58 y.o.  05/13/2013 8:24 AM  Chief Complaint: Followup and medication management.  History of Present Illness: Patrick RuizJohn came for her followup appointment.  He is compliant with Risperdal and denies any side effects.  He has been looking part-time work but unsuccessful.  He is not happy with on line process and he was to go in person to find a job.  However he is keeping himself busy by taking care of her sister .  Her sister has lymphoma and he is taking her to the doctor's appointment.  He denies any irritability, anger, paranoia or any hallucination.  He does have any tremors or shakes.  He is scheduled to have blood work and his physical check next week to his primary care physician .  Patient is not drinking or using any substances.  His appetite sleep is unchanged from the past.    Suicidal Ideation: No Plan Formed: No Patient has means to carry out plan: No  Homicidal Ideation: No Plan Formed: No Patient has means to carry out plan: No  Review of Systems: Psychiatric: Agitation: No Hallucination: No Depressed Mood: No Insomnia: No Hypersomnia: No Altered Concentration: No Feels Worthless: No Grandiose Ideas: No Belief In Special Powers: No New/Increased Substance Abuse: No Compulsions: No  Neurologic: Headache: No Seizure: No Paresthesias: No  Medical History:  Patient has a history of hypertension, hyperlipidemia and obesity.  His primary care physician is [name].    Outpatient Encounter Prescriptions as of 05/13/2013  Medication Sig  . quinapril (ACCUPRIL) 40 MG tablet Take 40 mg by mouth at bedtime.    Marland Kitchen. atorvastatin (LIPITOR) 40 MG tablet Take 40 mg by mouth daily.    . hydrochlorothiazide (HYDRODIURIL) 12.5 MG tablet Take 12.5 mg by mouth daily.  . metoprolol (LOPRESSOR) 100 MG tablet Take 100 mg by mouth 2 (two) times daily.    . risperiDONE (RISPERDAL) 3 MG tablet Take 1 tablet (3 mg  total) by mouth 2 (two) times daily.  . [DISCONTINUED] risperiDONE (RISPERDAL) 3 MG tablet Take 1 tablet (3 mg total) by mouth 2 (two) times daily.    Past Psychiatric History/Hospitalization(s): Patient has been diagnosed with schizophrenia since [id number].  He had a psychotic episode when he was working in [place].  Patient denies any previous history of psychiatric admission treatment or any suicidal plan.  In the past he has taken Stelazine.  He's been doing very well on Risperdal.  Anxiety: No Bipolar Disorder: No Depression: No Mania: No Psychosis: Yes Schizophrenia: Yes Personality Disorder: No Hospitalization for psychiatric illness: No History of Electroconvulsive Shock Therapy: No Prior Suicide Attempts: No  Physical Exam: Constitutional:  Wt 201 lb (91.173 kg)  General Appearance: alert, oriented, no acute distress and well nourished  Musculoskeletal: Strength & Muscle Tone: within normal limits Gait & Station: normal Patient leans: N/A  Psychiatric: Speech (describe rate, volume, coherence, spontaneity, and abnormalities if any): Clear and coherent .    Thought Process (describe rate, content, abstract reasoning, and computation): Logical and goal directed  Associations: Intact  Thoughts: normal and No paranoia or delusions  Mental Status: Orientation: oriented to person and situation Mood & Affect: anxiety Attention Span & Concentration: Good  Medical Decision Making (Choose Three): Established Problem, Stable/Improving (1), Review of Last Therapy Session (1) and Review of Medication Regimen & Side Effects (2)  Assessment: Axis I: Schizophrenia chronic paranoid type  Axis II:  deferred  Axis III:  Patient Active Problem  List   Diagnosis Date Noted  . HTN (hypertension) 05/07/2012  . Hyperlipidemia 05/07/2012    Axis IV: Mild  Axis V: 60-65   Plan: I recommended to have his blood work results faxed to us when he sees primary care physician  next week.  Patient is stable on Risperdal 3 mg twice a day.  Recommended to continue Risperdal.  Followup in 3 months unless needed to be seen before.   Deryn Massengale T., MD 05/13/2013

## 2013-08-12 ENCOUNTER — Ambulatory Visit (HOSPITAL_COMMUNITY): Payer: Self-pay | Admitting: Psychiatry

## 2013-08-14 ENCOUNTER — Ambulatory Visit (INDEPENDENT_AMBULATORY_CARE_PROVIDER_SITE_OTHER): Payer: Medicare Other | Admitting: Psychiatry

## 2013-08-14 ENCOUNTER — Encounter (HOSPITAL_COMMUNITY): Payer: Self-pay | Admitting: Psychiatry

## 2013-08-14 VITALS — BP 140/81 | HR 76 | Ht 67.0 in | Wt 201.2 lb

## 2013-08-14 DIAGNOSIS — F209 Schizophrenia, unspecified: Secondary | ICD-10-CM

## 2013-08-14 MED ORDER — RISPERIDONE 3 MG PO TABS: 3.0000 mg | ORAL_TABLET | Freq: Two times a day (BID) | ORAL | Status: DC

## 2013-08-14 NOTE — Progress Notes (Signed)
[facility] [id number] Progress Note  Patrick AntiguaJohn Moreno [id number] 58 y.o.  08/14/2013 2:51 PM  Chief Complaint: Followup and medication management.  History of Present Illness: Patrick Moreno came for her followup appointment.  He is taking his medication and denies any side effects.  He likes to Risperdal is helping his sleep and paranoia under control.  He denies any agitation, anger or any disorganized thinking.  He is disappointed because his sister did not responding very well to chemotherapy.  He is actively looking for a part-time job.  Recently he applied for [employer] but he is aware that he is not ready good in computer and may take a while to get a job.  Overall he is feeling better.  He denies any active or passive suicidal thoughts or homicidal thoughts.  He has no tremors or any shakes.  He saw his primary care physician and he was happy there were no new medication added.  He mentioned his cholesterol and blood sugar was normal.  His vitals are stable.  His gait is unchanged from the past.  Suicidal Ideation: No Plan Formed: No Patient has means to carry out plan: No  Homicidal Ideation: No Plan Formed: No Patient has means to carry out plan: No  Review of Systems: Psychiatric: Agitation: No Hallucination: No Depressed Mood: No Insomnia: No Hypersomnia: No Altered Concentration: No Feels Worthless: No Grandiose Ideas: No Belief In Special Powers: No New/Increased Substance Abuse: No Compulsions: No  Neurologic: Headache: No Seizure: No Paresthesias: No  Medical History:  Patient has a history of hypertension, hyperlipidemia and obesity.  His primary care physician is [name].    Outpatient Encounter Prescriptions as of 08/14/2013  Medication Sig  . atorvastatin (LIPITOR) 40 MG tablet Take 40 mg by mouth daily.    . hydrochlorothiazide (HYDRODIURIL) 12.5 MG tablet Take 12.5 mg by mouth daily.  . metoprolol (LOPRESSOR) 100 MG tablet Take 100 mg by mouth 2 (two)  times daily.    . quinapril (ACCUPRIL) 40 MG tablet Take 40 mg by mouth at bedtime.    . risperiDONE (RISPERDAL) 3 MG tablet Take 1 tablet (3 mg total) by mouth 2 (two) times daily.  . [DISCONTINUED] risperiDONE (RISPERDAL) 3 MG tablet Take 1 tablet (3 mg total) by mouth 2 (two) times daily.    Past Psychiatric History/Hospitalization(s): Patient has been diagnosed with schizophrenia since [id number].  He had a psychotic episode when he was working in [place].  Patient denies any previous history of psychiatric admission treatment or any suicidal plan.  In the past he has taken Stelazine.  He's been doing very well on Risperdal. Anxiety: No Bipolar Disorder: No Depression: No Mania: No Psychosis: Yes Schizophrenia: Yes Personality Disorder: No Hospitalization for psychiatric illness: No History of Electroconvulsive Shock Therapy: No Prior Suicide Attempts: No  Physical Exam: Constitutional:  BP 140/81  Pulse 76  Ht 5\' 7"  (1.702 m)  Wt 201 lb 3.2 oz (91.264 kg)  BMI 31.51 kg/m2  General Appearance: alert, oriented, no acute distress and well nourished  Musculoskeletal: Strength & Muscle Tone: within normal limits Gait & Station: normal Patient leans: N/A  Psychiatric: Speech (describe rate, volume, coherence, spontaneity, and abnormalities if any): Clear and coherent .    Thought Process (describe rate, content, abstract reasoning, and computation): Logical and goal directed  Associations: Intact  Thoughts: normal and No paranoia or delusions  Mental Status: Orientation: oriented to person and situation Mood & Affect: anxiety Attention Span & Concentration: Good  Established Problem, Stable/Improving (  1), Review of Last Therapy Session (1) and Review of Medication Regimen & Side Effects (2)  Assessment: Axis I: Schizophrenia chronic paranoid type  Axis II:  deferred  Axis III:  Patient Active Problem List   Diagnosis Date Noted  . HTN (hypertension) 05/07/2012   . Hyperlipidemia 05/07/2012    Axis IV: Mild  Axis V: 60-65   Plan: Patient is doing better on his current medication.  I will continue Risperdal 3 mg twice a day.  Recommended to call us back if he has any question or any concern.  Followup in 3 months.   Ally Knodel T., MD 08/14/2013

## 2013-11-04 ENCOUNTER — Ambulatory Visit (INDEPENDENT_AMBULATORY_CARE_PROVIDER_SITE_OTHER): Payer: Medicare Other | Admitting: Psychiatry

## 2013-11-04 ENCOUNTER — Encounter (HOSPITAL_COMMUNITY): Payer: Self-pay | Admitting: Psychiatry

## 2013-11-04 VITALS — BP 138/88 | HR 65 | Ht 68.0 in | Wt 204.4 lb

## 2013-11-04 DIAGNOSIS — F200 Paranoid schizophrenia: Secondary | ICD-10-CM

## 2013-11-04 MED ORDER — RISPERIDONE 3 MG PO TABS: 3.0000 mg | ORAL_TABLET | Freq: Two times a day (BID) | ORAL | Status: DC

## 2013-11-04 NOTE — Progress Notes (Signed)
[facility] [id number] Progress Note  Patrick AntiguaJohn Moreno [id number] 58 y.o.  11/04/2013 10:06 AM  Chief Complaint: Followup and medication management.  History of Present Illness: Patrick RuizJohn came for her followup appointment.  He is compliant with his Risperdal 3 mg twice a day.  He is sleeping good.  He is happy because her sister is responding well with the medication to her cancer.  He is unable to find a job but recently he applied in [employer] and he is hoping to get some hours .  He had blood work results which he bring with him .  He has blood work in April which shows normal CBC, CMP and lipid panel.  His BUN is 8 and creatinine 1 .  His total cholesterol is 165 and his sodium is 135 .  His glucose is 107 his CBC is normal.  Patient wants to continue his current psychotropic medication.  His appetite is okay.  His vitals are stable.  He has no tremors or shakes.  Denies any paranoia or any hallucination.  Suicidal Ideation: No Plan Formed: No Patient has means to carry out plan: No  Homicidal Ideation: No Plan Formed: No Patient has means to carry out plan: No  Review of Systems: Psychiatric: Agitation: No Hallucination: No Depressed Mood: No Insomnia: No Hypersomnia: No Altered Concentration: No Feels Worthless: No Grandiose Ideas: No Belief In Special Powers: No New/Increased Substance Abuse: No Compulsions: No  Neurologic: Headache: No Seizure: No Paresthesias: No  Medical History:  Patient has a history of hypertension, hyperlipidemia and obesity.  His primary care physician is [name].    Outpatient Encounter Prescriptions as of 11/04/2013  Medication Sig  . atorvastatin (LIPITOR) 40 MG tablet Take 40 mg by mouth daily.    . hydrochlorothiazide (HYDRODIURIL) 12.5 MG tablet Take 12.5 mg by mouth daily.  . metoprolol (LOPRESSOR) 100 MG tablet Take 100 mg by mouth 2 (two) times daily.    . quinapril (ACCUPRIL) 40 MG tablet Take 40 mg by mouth at bedtime.    .  risperiDONE (RISPERDAL) 3 MG tablet Take 1 tablet (3 mg total) by mouth 2 (two) times daily.  . [DISCONTINUED] risperiDONE (RISPERDAL) 3 MG tablet Take 1 tablet (3 mg total) by mouth 2 (two) times daily.    Past Psychiatric History/Hospitalization(s): Patient has been diagnosed with schizophrenia since [id number].  He had a psychotic episode when he was working in [place].  Patient denies any previous history of psychiatric admission treatment or any suicidal plan.  In the past he has taken Stelazine.  He's been doing very well on Risperdal. Anxiety: No Bipolar Disorder: No Depression: No Mania: No Psychosis: Yes Schizophrenia: Yes Personality Disorder: No Hospitalization for psychiatric illness: No History of Electroconvulsive Shock Therapy: No Prior Suicide Attempts: No  Physical Exam: Constitutional:  BP 138/88  Pulse 65  Ht 5\' 8"  (1.727 m)  Wt 204 lb 6.4 oz (92.715 kg)  BMI 31.09 kg/m2  General Appearance: alert, oriented, no acute distress and well nourished  Musculoskeletal: Strength & Muscle Tone: within normal limits Gait & Station: normal Patient leans: N/A  Psychiatric: Speech (describe rate, volume, coherence, spontaneity, and abnormalities if any): Clear and coherent .    Thought Process (describe rate, content, abstract reasoning, and computation): Logical and goal directed  Associations: Intact  Thoughts: normal and No paranoia or delusions  Mental Status: Orientation: oriented to person and situation Mood & Affect: anxiety Attention Span & Concentration: Good  Established Problem, Stable/Improving (1), Review of Last  Therapy Session (1) and Review of Medication Regimen & Side Effects (2)  Assessment: Axis I: Schizophrenia chronic paranoid type  Axis II:  deferred  Axis III:  Patient Active Problem List   Diagnosis Date Noted  . HTN (hypertension) 05/07/2012  . Hyperlipidemia 05/07/2012    Axis IV: Mild  Axis V: 60-65   Plan: Patient is  stable on his current medication.  I reviewed his blood work results which is normal.  I will continue Risperdal 3 mg twice a day.  Recommended to call us back if he has any question or any concern.  Followup in 3 months.   Willeen Novak T., MD 11/04/2013

## 2014-02-03 ENCOUNTER — Ambulatory Visit (HOSPITAL_COMMUNITY): Payer: Self-pay | Admitting: Psychiatry

## 2014-02-06 ENCOUNTER — Ambulatory Visit (INDEPENDENT_AMBULATORY_CARE_PROVIDER_SITE_OTHER): Payer: Medicare Other | Admitting: Psychiatry

## 2014-02-06 ENCOUNTER — Encounter (HOSPITAL_COMMUNITY): Payer: Self-pay | Admitting: Psychiatry

## 2014-02-06 VITALS — BP 141/81 | HR 64 | Ht 67.0 in | Wt 203.2 lb

## 2014-02-06 DIAGNOSIS — F200 Paranoid schizophrenia: Secondary | ICD-10-CM

## 2014-02-06 MED ORDER — RISPERIDONE 3 MG PO TABS: 3.0000 mg | ORAL_TABLET | Freq: Two times a day (BID) | ORAL | Status: DC

## 2014-02-06 NOTE — Progress Notes (Signed)
[facility] [id number] Progress Note  Patrick Moreno [id number] 59 y.o.  02/06/2014 9:51 AM  Chief Complaint: Followup and medication management.  History of Present Illness: Patrick Moreno came for her followup appointment.  He is taking Risperdal 3 mg twice a day.  He denies any side effects including any shakes tremors or any EPS.  His sister is not doing very well and she may need a bone transplant .  Patient is going to see if his blood and bone marrow matches so he can donate to her sister.  He is still unemployed however he has not looking for a job because he is taking care of his sister.  He denies any paranoia or any hallucination.  His sleep is good.  His appetite is okay.  His vitals are stable.  Denies any crying spells or any depressive thoughts.  He wants to continue Risperdal at present dose.    Suicidal Ideation: No Plan Formed: No Patient has means to carry out plan: No  Homicidal Ideation: No Plan Formed: No Patient has means to carry out plan: No  Review of Systems: Psychiatric: Agitation: No Hallucination: No Depressed Mood: No Insomnia: No Hypersomnia: No Altered Concentration: No Feels Worthless: No Grandiose Ideas: No Belief In Special Powers: No New/Increased Substance Abuse: No Compulsions: No  Neurologic: Headache: No Seizure: No Paresthesias: No  Medical History:  Patient has a history of hypertension, hyperlipidemia and obesity.  His primary care physician is Dr. Doristine Counter.    Outpatient Encounter Prescriptions as of 02/06/2014  Medication Sig  . atorvastatin (LIPITOR) 40 MG tablet Take 40 mg by mouth daily.    . hydrochlorothiazide (HYDRODIURIL) 12.5 MG tablet Take 12.5 mg by mouth daily.  . metoprolol (LOPRESSOR) 100 MG tablet Take 100 mg by mouth 2 (two) times daily.    . quinapril (ACCUPRIL) 40 MG tablet Take 40 mg by mouth at bedtime.    . risperiDONE (RISPERDAL) 3 MG tablet Take 1 tablet (3 mg total) by mouth 2 (two) times daily.  .  [DISCONTINUED] risperiDONE (RISPERDAL) 3 MG tablet Take 1 tablet (3 mg total) by mouth 2 (two) times daily.    Past Psychiatric History/Hospitalization(s): Patient has been diagnosed with schizophrenia since 33.  He had a psychotic episode when he was working in [place].  Patient denies any previous history of psychiatric admission treatment or any suicidal plan.  In the past he has taken Stelazine.  He's been doing very well on Risperdal. Anxiety: No Bipolar Disorder: No Depression: No Mania: No Psychosis: Yes Schizophrenia: Yes Personality Disorder: No Hospitalization for psychiatric illness: No History of Electroconvulsive Shock Therapy: No Prior Suicide Attempts: No  Physical Exam: Constitutional:  BP 141/81 mmHg  Pulse 64  Ht  (1.702 m)  Wt 203 lb 3.2 oz (92.171 kg)  BMI 31.82 kg/m2  General Appearance: alert, oriented, no acute distress and well nourished  Musculoskeletal: Strength & Muscle Tone: within normal limits Gait & Station: normal Patient leans: N/A  Psychiatric: Speech (describe rate, volume, coherence, spontaneity, and abnormalities if any): Clear and coherent .    Thought Process (describe rate, content, abstract reasoning, and computation): Logical and goal directed  Associations: Intact  Thoughts: normal and No paranoia or delusions  Mental Status: Orientation: oriented to person and situation Mood & Affect: anxiety Attention Span & Concentration: Good  Established Problem, Stable/Improving (1), Review of Last Therapy Session (1) and Review of Medication Regimen & Side Effects (2)  Assessment: Axis I: Schizophrenia chronic paranoid type  Axis  II:  deferred  Axis III:  Patient Active Problem List   Diagnosis Date Noted  . HTN (hypertension) 05/07/2012  . Hyperlipidemia 05/07/2012    Axis IV: Mild  Axis V: 60-65   Plan: Patient is stable on his current medication.  I reviewed his blood work results which is normal.  I will  continue Risperdal 3 mg twice a day.  Recommended to call us back if he has any question or any concern.  Followup in 3 months.   Patrick Moreno T., MD 02/06/2014

## 2014-05-08 ENCOUNTER — Encounter (HOSPITAL_COMMUNITY): Payer: Self-pay | Admitting: Psychiatry

## 2014-05-08 ENCOUNTER — Ambulatory Visit (INDEPENDENT_AMBULATORY_CARE_PROVIDER_SITE_OTHER): Payer: Medicare Other | Admitting: Psychiatry

## 2014-05-08 VITALS — BP 136/85 | HR 60 | Ht 68.0 in | Wt 199.4 lb

## 2014-05-08 DIAGNOSIS — F200 Paranoid schizophrenia: Secondary | ICD-10-CM

## 2014-05-08 MED ORDER — RISPERIDONE 3 MG PO TABS: 3.0000 mg | ORAL_TABLET | Freq: Two times a day (BID) | ORAL | Status: DC

## 2014-05-08 NOTE — Progress Notes (Signed)
[facility] [id number]  Patrick Moreno [id number] 59 y.o.  05/08/2014 10:31 AM  Chief Complaint: Followup and medication management.  History of Present Illness: Patrick Moreno came for her followup appointment.  He mentioned that his sister who had cancer died on 02/20/2015.  He was feeling sad but he had a good family support.  He sleeping okay.  He denies any irritability, anger, crying spells.  He's taking Risperdal 3 mg twice a day.  He has no side effects.  He had blood work at his primary care physician on January 18.  He has normal CBC, chemistry, lipid panel.  There has been no changes in his medication.  He has no shakes, tremors or EPS.  Patient is still looking for a part-time job and he had applied at [employer] .  Patient denies drinking or using any illegal substances.  He denies any paranoia, hallucination, irritability, anger or any self abusive behavior.  Patient denies drinking or using any illegal substances.  His appetite is okay.  His vitals are stable.  Suicidal Ideation: No Plan Formed: No Patient has means to carry out plan: No  Homicidal Ideation: No Plan Formed: No Patient has means to carry out plan: No  Review of Systems: Psychiatric: Agitation: No Hallucination: No Depressed Mood: No Insomnia: No Hypersomnia: No Altered Concentration: No Feels Worthless: No Grandiose Ideas: No Belief In Special Powers: No New/Increased Substance Abuse: No Compulsions: No  Neurologic: Headache: No Seizure: No Paresthesias: No  Medical History:  Patient has a history of hypertension, hyperlipidemia and obesity.  His primary care physician is Patrick Moreno.    Outpatient Encounter Prescriptions as of 05/08/2014  Medication Sig  . atorvastatin (LIPITOR) 40 MG tablet Take 40 mg by mouth daily.    . hydrochlorothiazide (HYDRODIURIL) 12.5 MG tablet Take 12.5 mg by mouth daily.  . metoprolol (LOPRESSOR) 100 MG tablet Take 100 mg by mouth 2 (two) times daily.     . quinapril (ACCUPRIL) 40 MG tablet Take 40 mg by mouth at bedtime.    . risperiDONE (RISPERDAL) 3 MG tablet Take 1 tablet (3 mg total) by mouth 2 (two) times daily.  . [DISCONTINUED] risperiDONE (RISPERDAL) 3 MG tablet Take 1 tablet (3 mg total) by mouth 2 (two) times daily.    Past Psychiatric History/Hospitalization(s): Patient has been diagnosed with schizophrenia since [id number].  He had a psychotic episode when he was working in [place].  Patient denies any previous history of psychiatric admission treatment or any suicidal plan.  In the past he has taken Stelazine.  He's been doing very well on Risperdal. Anxiety: No Bipolar Disorder: No Depression: No Mania: No Psychosis: Yes Schizophrenia: Yes Personality Disorder: No Hospitalization for psychiatric illness: No History of Electroconvulsive Shock Therapy: No Prior Suicide Attempts: No  Physical Exam: Constitutional:  BP 136/85 mmHg  Pulse 60  Ht 5\' 8"  (1.727 m)  Wt 199 lb 6.4 oz (90.447 kg)  BMI 30.33 kg/m2  General Appearance: alert, oriented, no acute distress and well nourished  Musculoskeletal: Strength & Muscle Tone: within normal limits Gait & Station: normal Patient leans: N/A  Psychiatric: Speech (describe rate, volume, coherence, spontaneity, and abnormalities if any): Clear and coherent .    Thought Process (describe rate, content, abstract reasoning, and computation): Logical and goal directed  Associations: Intact  Thoughts: normal and No paranoia or delusions  Mental Status: Orientation: oriented to person and situation Mood & Affect: anxiety Attention Span & Concentration: Good  Established Problem, Stable/Improving (1), Review  of Last Therapy Session (1) and Review of Medication Regimen & Side Effects (2)  Assessment: Axis I: Schizophrenia chronic paranoid type  Axis II:  deferred  Axis III:  Patient Active Problem List   Diagnosis Date Noted  . HTN (hypertension) 05/07/2012  .  Hyperlipidemia 05/07/2012    Plan: Reassurance given.  Recommended grief counseling but patient denied.  I review his blood work which is normal.  Continue Risperdal 3 mg twice a day.  Recommended to call us back if he has any question, concern or if he feel worsening of the symptom.  Follow-up in 3 months.   Jakaila Norment T., MD 05/08/2014

## 2014-07-28 ENCOUNTER — Other Ambulatory Visit (HOSPITAL_COMMUNITY): Payer: Self-pay | Admitting: Psychiatry

## 2014-07-28 ENCOUNTER — Telehealth (HOSPITAL_COMMUNITY): Payer: Self-pay | Admitting: *Deleted

## 2014-07-28 DIAGNOSIS — F200 Paranoid schizophrenia: Secondary | ICD-10-CM

## 2014-07-28 MED ORDER — RISPERIDONE 3 MG PO TABS: 3.0000 mg | ORAL_TABLET | Freq: Two times a day (BID) | ORAL | Status: DC

## 2014-07-28 NOTE — Telephone Encounter (Signed)
Pt called requesting refill of Risperidone. Chart reviewed, refill appropriate.

## 2014-08-07 ENCOUNTER — Ambulatory Visit (HOSPITAL_COMMUNITY): Payer: Self-pay | Admitting: Psychiatry

## 2014-08-27 ENCOUNTER — Telehealth (HOSPITAL_COMMUNITY): Payer: Self-pay

## 2014-08-27 DIAGNOSIS — F200 Paranoid schizophrenia: Secondary | ICD-10-CM

## 2014-08-27 MED ORDER — RISPERIDONE 3 MG PO TABS: 3.0000 mg | ORAL_TABLET | Freq: Two times a day (BID) | ORAL | Status: DC

## 2014-08-27 NOTE — Telephone Encounter (Signed)
Medication refill request for Risperdal needed to bridge until rescheduled appointment on 09/17/14 as patient was rescheduled from 08/07/14 due to provider being out that date.  New one time order authorized by Dr. Lolly Mustache and e-scribed into patient's [facility] on [address].  Called patient back to inform one time refill order was approved and reminded him of need to keep appointment on 09/17/14.

## 2014-09-17 ENCOUNTER — Ambulatory Visit (HOSPITAL_COMMUNITY): Payer: Self-pay | Admitting: Psychiatry

## 2014-10-07 ENCOUNTER — Encounter (HOSPITAL_COMMUNITY): Payer: Self-pay | Admitting: Psychiatry

## 2014-10-07 ENCOUNTER — Ambulatory Visit (INDEPENDENT_AMBULATORY_CARE_PROVIDER_SITE_OTHER): Payer: Medicare Other | Admitting: Psychiatry

## 2014-10-07 VITALS — BP 108/78 | HR 63 | Ht 67.5 in | Wt 195.8 lb

## 2014-10-07 DIAGNOSIS — F200 Paranoid schizophrenia: Secondary | ICD-10-CM

## 2014-10-07 MED ORDER — RISPERIDONE 3 MG PO TABS: 3.0000 mg | ORAL_TABLET | Freq: Two times a day (BID) | ORAL | Status: DC

## 2014-10-07 NOTE — Progress Notes (Signed)
Patrick Moreno [id number] Progress Note  Patrick Moreno [id number] 59 y.o.  10/07/2014 2:08 PM  Chief Complaint: Followup and medication management.  History of Present Illness: [name] came for her followup appointment.  He is taking his medication as prescribed.  He is still missed her deceased sister who died earlier this year.  He tried to keep up visitation with her niece on a regular basis.  Overall he described his mood good.  He denies any irritability, anger, paranoia or any hallucination.  He sleeping good.  He lost weight and he is happy about it.  He is active, social and due to her exercise.  He has no tremors, shakes or any EPS.  He denies drinking or using any illegal substances.  He is not looking for a part-time job at this time.  He is on disability and sometime get financial help from his brother.  His appetite is okay.  His vitals are stable.  Suicidal Ideation: No Plan Formed: No Patient has means to carry out plan: No  Homicidal Ideation: No Plan Formed: No Patient has means to carry out plan: No  Review of Systems: Psychiatric: Agitation: No Hallucination: No Depressed Mood: No Insomnia: No Hypersomnia: No Altered Concentration: No Feels Worthless: No Grandiose Ideas: No Belief In Special Powers: No New/Increased Substance Abuse: No Compulsions: No  Neurologic: Headache: No Seizure: No Paresthesias: No  Medical History:  Patient has a history of hypertension, hyperlipidemia and obesity.  His primary care physician is Dr. Doristine Moreno.    Outpatient Encounter Prescriptions as of 10/07/2014  Medication Sig  . atorvastatin (LIPITOR) 40 MG tablet Take 40 mg by mouth daily.    . hydrochlorothiazide (HYDRODIURIL) 12.5 MG tablet Take 12.5 mg by mouth daily.  . metoprolol (LOPRESSOR) 100 MG tablet Take 100 mg by mouth 2 (two) times daily.    . quinapril (ACCUPRIL) 40 MG tablet Take 40 mg by mouth at bedtime.    . risperiDONE (RISPERDAL) 3 MG tablet Take 1 tablet  (3 mg total) by mouth 2 (two) times daily.  . [DISCONTINUED] risperiDONE (RISPERDAL) 3 MG tablet Take 1 tablet (3 mg total) by mouth 2 (two) times daily.   No facility-administered encounter medications on file as of 10/07/2014.    Past Psychiatric History/Hospitalization(s): Patient has been diagnosed with schizophrenia since 63.  He had a psychotic episode when he was working in [place].  Patient denies any previous history of psychiatric admission treatment or any suicidal plan.  In the past he has taken Stelazine.  He's been doing very well on Risperdal. Anxiety: No Bipolar Disorder: No Depression: No Mania: No Psychosis: Yes Schizophrenia: Yes Personality Disorder: No Hospitalization for psychiatric illness: No History of Electroconvulsive Shock Therapy: No Prior Suicide Attempts: No  Physical Exam: Constitutional:  BP 108/78 mmHg  Pulse 63  Ht 5' 7.5" (1.715 m)  Wt 195 lb 12.8 oz (88.814 kg)  BMI 30.20 kg/m2  General Appearance: alert, oriented, no acute distress and well nourished  Musculoskeletal: Strength & Muscle Tone: within normal limits Gait & Station: normal Patient leans: N/A  Psychiatric Specialty Exam: Physical Exam  ROS  Blood pressure 108/78, pulse 63, height 5' 7.5" (1.715 m), weight 195 lb 12.8 oz (88.814 kg).Body mass index is 30.2 kg/(m^2).  General Appearance: Casual  Eye Contact::  Good  Speech:  Slow  Volume:  Decreased  Mood:  Euthymic  Affect:  Appropriate  Thought Process:  Logical  Orientation:  Full (Time, Place, and Person)  Thought Content:  WDL  Suicidal Thoughts:  No  Homicidal Thoughts:  No  Memory:  Immediate;   Fair Recent;   Fair Remote;   Fair  Judgement:  Good  Insight:  Good  Psychomotor Activity:  Normal  Concentration:  Fair  Recall:  Good  Fund of Knowledge:  Good  Language:  Good  Akathisia:  No  Handed:  Right  AIMS (if indicated):     Assets:  Communication Skills Desire for Improvement Financial  Resources/Insurance Housing Physical Moreno  ADL's:  Intact  Cognition:  WNL  Sleep:       Established Problem, Stable/Improving (1), Review of Last Therapy Session (1) and Review of Medication Regimen & Side Effects (2)  Assessment: Axis I: Schizophrenia chronic paranoid type  Axis II:  deferred  Axis III:  Patient Active Problem List   Diagnosis Date Noted  . HTN (hypertension) 05/07/2012  . Hyperlipidemia 05/07/2012    Plan: Patient is a stable on his current medication.  He does not want any grief counseling.  He like to continue his Risperdal 3 mg twice a day.  He has no rash , itching, EPS, shakes or tremors.  Discussed medication side effects and benefits. Recommended to call us back if he has any question, concern or if he feel worsening of the symptom.  Follow-up in 3 months.   Patrick Moreno T., MD 10/07/2014

## 2015-01-06 ENCOUNTER — Encounter (HOSPITAL_COMMUNITY): Payer: Self-pay | Admitting: Psychiatry

## 2015-01-06 ENCOUNTER — Ambulatory Visit (INDEPENDENT_AMBULATORY_CARE_PROVIDER_SITE_OTHER): Payer: Medicare Other | Admitting: Psychiatry

## 2015-01-06 VITALS — BP 112/74 | HR 60 | Ht 68.5 in | Wt 195.4 lb

## 2015-01-06 DIAGNOSIS — F200 Paranoid schizophrenia: Secondary | ICD-10-CM

## 2015-01-06 MED ORDER — RISPERIDONE 3 MG PO TABS: 3.0000 mg | ORAL_TABLET | Freq: Two times a day (BID) | ORAL | Status: DC

## 2015-01-06 NOTE — Progress Notes (Addendum)
[facility] [address] Progress Note  Patrick Moreno [id number] 59 y.o.  01/06/2015 2:41 PM  Chief Complaint: Followup and medication management.  History of Present Illness: Patrick Moreno came for her followup appointment.  He is taking Patrick medication without any side effects.  He sleeping good.  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  He is still missed some time Patrick Moreno who deceased earlier this year and he usually visits her Moreno whenever he has a time.  He sleeping good.  He denies any irritability, paranoia, anxiety or any nervousness.  Patrick thinking is clear.  He denies any hallucination or any feeling of hopelessness or worthlessness.  He has no tremors shakes or any EPS.  He denies drinking or using any illegal substances.  He is hoping to get Medicare next year as he will become 59 year old.  Patrick appetite is okay.  Patrick vitals are stable.  He lives by himself.  Patient brought blood work results which was drawn on October 24.  Patrick CBC is normal, Patrick total cholesterol is 154 and Patrick basic chemistry is also normal.  Patrick creatinine is 0.80 and BUN 9.  Suicidal Ideation: No Plan Formed: No Patient has means to carry out plan: No  Homicidal Ideation: No Plan Formed: No Patient has means to carry out plan: No  Review of Systems: Psychiatric: Agitation: No Hallucination: No Depressed Mood: No Insomnia: No Hypersomnia: No Altered Concentration: No Feels Worthless: No Grandiose Ideas: No Belief In Special Powers: No New/Increased Substance Abuse: No Compulsions: No  Neurologic: Headache: No Seizure: No Paresthesias: No  Medical History:  Patient has a history of hypertension, hyperlipidemia and obesity.  Patrick primary care physician is Dr. Doristine Counter.    Outpatient Encounter Prescriptions as of 01/06/2015  Medication Sig  . atorvastatin (LIPITOR) 40 MG tablet Take 40 mg by mouth daily.    . hydrochlorothiazide (HYDRODIURIL) 12.5 MG tablet  Take 12.5 mg by mouth daily.  . metoprolol (LOPRESSOR) 100 MG tablet Take 100 mg by mouth 2 (two) times daily.    . quinapril (ACCUPRIL) 40 MG tablet Take 40 mg by mouth at bedtime.    . risperiDONE (RISPERDAL) 3 MG tablet Take 1 tablet (3 mg total) by mouth 2 (two) times daily.  . [DISCONTINUED] risperiDONE (RISPERDAL) 3 MG tablet Take 1 tablet (3 mg total) by mouth 2 (two) times daily.   No facility-administered encounter medications on file as of 01/06/2015.    Past Psychiatric History/Hospitalization(s): Patient has been diagnosed with schizophrenia since 12.  He had a psychotic episode when he was working in [place].  Patient denies any previous history of psychiatric admission treatment or any suicidal plan.  In the past he has taken Stelazine.  He's been doing very well on Risperdal. Anxiety: No Bipolar Disorder: No Depression: No Mania: No Psychosis: Yes Schizophrenia: Yes Personality Disorder: No Hospitalization for psychiatric illness: No History of Electroconvulsive Shock Therapy: No Prior Suicide Attempts: No  Physical Exam: Constitutional:  BP 112/74 mmHg  Pulse 60  Ht 5' 8.5" (1.74 m)  Wt 195 lb 6.4 oz (88.633 kg)  BMI 29.28 kg/m2  General Appearance: alert, oriented, no acute distress and well nourished  Musculoskeletal: Strength & Muscle Tone: within normal limits Gait & Station: normal Patient leans: N/A  Psychiatric Specialty Exam: Physical Exam  ROS  Blood pressure 112/74, pulse 60, height 5' 8.5" (1.74 m), weight 195 lb 6.4 oz (88.633 kg).Body mass index is 29.28 kg/(m^2).  General  Appearance: Casual  Eye Contact::  Good  Speech:  Slow  Volume:  Decreased  Mood:  Euthymic  Affect:  Appropriate  Thought Process:  Logical  Orientation:  Full (Time, Place, and Person)  Thought Content:  WDL  Suicidal Thoughts:  No  Homicidal Thoughts:  No  Memory:  Immediate;   Fair Recent;   Fair Remote;   Fair  Judgement:  Good  Insight:  Good   Psychomotor Activity:  Normal  Concentration:  Fair  Recall:  Good  Fund of Knowledge:  Good  Language:  Good  Akathisia:  No  Handed:  Right  AIMS (if indicated):     Assets:  Communication Skills Desire for Improvement Financial Resources/Insurance Housing Physical Health  ADL's:  Intact  Cognition:  WNL  Sleep:       Established Problem, Stable/Improving (1), Review of Last Therapy Session (1) and Review of Medication Regimen & Side Effects (2)  Assessment: Axis I: Schizophrenia chronic paranoid type  Axis II:  deferred  Axis III:  Patient Active Problem List   Diagnosis Date Noted  . HTN (hypertension) 05/07/2012  . Hyperlipidemia 05/07/2012    Plan: Patient is a stable on Patrick current medication.  He like to continue Patrick Risperdal 3 mg twice a day.  He has no rash , itching, EPS, shakes or tremors.  I reviewed blood work results and discussed medication side effects and benefits. Recommended to call us back if he has any question, concern or if he feel worsening of the symptom.  Follow-up in 3 months.   Raeshawn Tafolla T., MD 01/06/2015

## 2015-04-07 ENCOUNTER — Encounter (HOSPITAL_COMMUNITY): Payer: Self-pay | Admitting: Psychiatry

## 2015-04-07 ENCOUNTER — Ambulatory Visit (INDEPENDENT_AMBULATORY_CARE_PROVIDER_SITE_OTHER): Payer: Medicare Other | Admitting: Psychiatry

## 2015-04-07 VITALS — BP 128/72 | HR 69 | Ht 68.5 in | Wt 196.4 lb

## 2015-04-07 DIAGNOSIS — F200 Paranoid schizophrenia: Secondary | ICD-10-CM

## 2015-04-07 MED ORDER — RISPERIDONE 3 MG PO TABS: 3.0000 mg | ORAL_TABLET | Freq: Two times a day (BID) | ORAL | Status: DC

## 2015-04-07 NOTE — Progress Notes (Signed)
[facility] [id number] Progress Note  Patrick AntiguaJohn Moreno [id number] 60 y.o.  04/07/2015 10:30 AM  Chief Complaint: Followup and medication management.  History of Present Illness: Patrick RuizJohn came for her followup appointment.  He had a quiet Christmas .  He visited his brother Patrick Moreno .  This was his first Christmas since his sister deceased last year.  Patient told it was difficult but finally he is accepting the loss.  He admitted in the beginning he was very withdrawn sad depressed but now he is feeling back to himself.  He is taking his medication as prescribed.  He denies any irritability, anger, paranoia or any hallucination.  His thinking is clear.  He denies any agitation, crying spells or any feeling of hopelessness or worthlessness.  He sleeping good.  He will be 60 this year and then he is hoping to get Medicare.  He lives by himself.  Patient denies drinking or using any illegal substances.  His appetite is okay.  His energy level is good.  He walks 2-3 miles every other day and he enjoys walking.  His vitals are stable.  He has no tremors shakes or any EPS.  Suicidal Ideation: No Plan Formed: No Patient has means to carry out plan: No  Homicidal Ideation: No Plan Formed: No Patient has means to carry out plan: No  Review of Systems: Psychiatric: Agitation: No Hallucination: No Depressed Mood: No Insomnia: No Hypersomnia: No Altered Concentration: No Feels Worthless: No Grandiose Ideas: No Belief In Special Powers: No New/Increased Substance Abuse: No Compulsions: No  Neurologic: Headache: No Seizure: No Paresthesias: No  Medical History:  Patient has a history of hypertension, hyperlipidemia and obesity.  His primary care physician is [name].    Outpatient Encounter Prescriptions as of 04/07/2015  Medication Sig  . atorvastatin (LIPITOR) 40 MG tablet Take 40 mg by mouth daily.    . hydrochlorothiazide (HYDRODIURIL) 12.5 MG tablet Take 12.5 mg by mouth daily.   . metoprolol (LOPRESSOR) 100 MG tablet Take 100 mg by mouth 2 (two) times daily.    . quinapril (ACCUPRIL) 40 MG tablet Take 40 mg by mouth at bedtime.    . risperiDONE (RISPERDAL) 3 MG tablet Take 1 tablet (3 mg total) by mouth 2 (two) times daily.  . [DISCONTINUED] risperiDONE (RISPERDAL) 3 MG tablet Take 1 tablet (3 mg total) by mouth 2 (two) times daily.   No facility-administered encounter medications on file as of 04/07/2015.    Past Psychiatric History/Hospitalization(s): Patient has been diagnosed with schizophrenia since [address]1991.  He had a psychotic episode when he was working in [place].  Patient denies any previous history of psychiatric admission treatment or any suicidal plan.  In the past he has taken Stelazine.  He's been doing very well on Risperdal. Anxiety: No Bipolar Disorder: No Depression: No Mania: No Psychosis: Yes Schizophrenia: Yes Personality Disorder: No Hospitalization for psychiatric illness: No History of Electroconvulsive Shock Therapy: No Prior Suicide Attempts: No  Physical Exam: Constitutional:  BP 128/72 mmHg  Pulse 69  Ht 5' 8.5" (1.74 m)  Wt 196 lb 6.4 oz (89.086 kg)  BMI 29.42 kg/m2  General Appearance: alert, oriented, no acute distress and well nourished  Musculoskeletal: Strength & Muscle Tone: within normal limits Gait & Station: normal Patient leans: N/A  Psychiatric Specialty Exam: Physical Exam  ROS  Blood pressure 128/72, pulse 69, height 5' 8.5" (1.74 m), weight 196 lb 6.4 oz (89.086 kg).Body mass index is 29.42 kg/(m^2).  General Appearance: Casual  Eye  Contact::  Good  Speech:  Slow  Volume:  Decreased  Mood:  Euthymic  Affect:  Appropriate  Thought Process:  Logical  Orientation:  Full (Time, Place, and Person)  Thought Content:  WDL  Suicidal Thoughts:  No  Homicidal Thoughts:  No  Memory:  Immediate;   Fair Recent;   Fair Remote;   Fair  Judgement:  Good  Insight:  Good  Psychomotor Activity:  Normal   Concentration:  Fair  Recall:  Good  Fund of Knowledge:  Good  Language:  Good  Akathisia:  No  Handed:  Right  AIMS (if indicated):     Assets:  Communication Skills Desire for Improvement Financial Resources/Insurance Housing Physical Health  ADL's:  Intact  Cognition:  WNL  Sleep:       Established Problem, Stable/Improving (1), Review of Last Therapy Session (1) and Review of Medication Regimen & Side Effects (2)  Assessment: Axis I: Schizophrenia chronic paranoid type  Axis II:  deferred  Axis III:  Patient Active Problem List   Diagnosis Date Noted  . HTN (hypertension) 05/07/2012  . Hyperlipidemia 05/07/2012    Plan: Patient is a stable on his current medication.  We have discussed to lower his Risperdal but patient does not want to change his dose at this time.  He like to continue his Risperdal 3 mg twice a day.  He has no rash , itching, EPS, shakes or tremors.  He is not interested in counseling.  Recommended to call us back if he has any question, concern or if he feel worsening of the symptom.  Follow-up in 3 months.   Raiana Pharris T., MD 04/07/2015

## 2015-07-03 ENCOUNTER — Encounter (HOSPITAL_COMMUNITY): Payer: Self-pay | Admitting: Psychiatry

## 2015-07-03 ENCOUNTER — Ambulatory Visit (INDEPENDENT_AMBULATORY_CARE_PROVIDER_SITE_OTHER): Payer: Medicare Other | Admitting: Psychiatry

## 2015-07-03 VITALS — BP 110/68 | HR 66 | Ht 68.0 in | Wt 196.6 lb

## 2015-07-03 DIAGNOSIS — F200 Paranoid schizophrenia: Secondary | ICD-10-CM

## 2015-07-03 MED ORDER — RISPERIDONE 3 MG PO TABS: 3.0000 mg | ORAL_TABLET | Freq: Two times a day (BID) | ORAL | Status: DC

## 2015-07-03 NOTE — Progress Notes (Signed)
[facility] [id number] Progress Note  Patrick AntiguaJohn Moreno [id number] 60 y.o.  07/03/2015 9:05 AM  Chief Complaint: Followup and medication management.  History of Present Illness: Patrick RuizJohn came for her followup appointment.  He is taking his medication as prescribed.  Recently he had a hip pain and he is taking some pain medication.  He is scheduled to see his primary care physician next month.  He described his mood is stable.  He denies any irritability, anger, mood swing.  His paranoia is under control.  He sleeping good.  His energy level is good.  Recently one of his brother became grandfather is very happy about it.  He denies any agitation, anger.  His appetite is okay.  His vitals are stable. He has no tremors shakes or any EPS.  Suicidal Ideation: No Plan Formed: No Patient has means to carry out plan: No  Homicidal Ideation: No Plan Formed: No Patient has means to carry out plan: No  Review of Systems: Psychiatric: Agitation: No Hallucination: No Depressed Mood: No Insomnia: No Hypersomnia: No Altered Concentration: No Feels Worthless: No Grandiose Ideas: No Belief In Special Powers: No New/Increased Substance Abuse: No Compulsions: No  Neurologic: Headache: No Seizure: No Paresthesias: No  Medical History:  Patient has a history of hypertension, hyperlipidemia and obesity.  His primary care physician is [name].    Outpatient Encounter Prescriptions as of 07/03/2015  Medication Sig  . atorvastatin (LIPITOR) 40 MG tablet Take 40 mg by mouth daily.    . hydrochlorothiazide (HYDRODIURIL) 12.5 MG tablet Take 12.5 mg by mouth daily.  . metoprolol (LOPRESSOR) 100 MG tablet Take 100 mg by mouth 2 (two) times daily.    . quinapril (ACCUPRIL) 40 MG tablet Take 40 mg by mouth at bedtime.    . risperiDONE (RISPERDAL) 3 MG tablet Take 1 tablet (3 mg total) by mouth 2 (two) times daily.  . [DISCONTINUED] risperiDONE (RISPERDAL) 3 MG tablet Take 1 tablet (3 mg total) by  mouth 2 (two) times daily.   No facility-administered encounter medications on file as of 07/03/2015.    Past Psychiatric History/Hospitalization(s): Patient has been diagnosed with schizophrenia since [address]1991.  He had a psychotic episode when he was working in [place].  Patient denies any previous history of psychiatric admission treatment or any suicidal plan.  In the past he has taken Stelazine.  He's been doing very well on Risperdal. Anxiety: No Bipolar Disorder: No Depression: No Mania: No Psychosis: Yes Schizophrenia: Yes Personality Disorder: No Hospitalization for psychiatric illness: No History of Electroconvulsive Shock Therapy: No Prior Suicide Attempts: No  Physical Exam: Constitutional:  BP 110/68 mmHg  Pulse 66  Ht 5\' 8"  (1.727 m)  Wt 196 lb 9.6 oz (89.177 kg)  BMI 29.90 kg/m2  General Appearance: alert, oriented, no acute distress and well nourished  Musculoskeletal: Strength & Muscle Tone: within normal limits Gait & Station: normal Patient leans: N/A  Psychiatric Specialty Exam: Physical Exam  ROS  Blood pressure 110/68, pulse 66, height 5\' 8"  (1.727 m), weight 196 lb 9.6 oz (89.177 kg).Body mass index is 29.9 kg/(m^2).  General Appearance: Casual  Eye Contact::  Good  Speech:  Slow  Volume:  Normal  Mood:  Euthymic  Affect:  Appropriate  Thought Process:  Logical  Orientation:  Full (Time, Place, and Person)  Thought Content:  WDL  Suicidal Thoughts:  No  Homicidal Thoughts:  No  Memory:  Immediate;   Fair Recent;   Fair Remote;   Fair  Judgement:  Good  Insight:  Good  Psychomotor Activity:  Normal  Concentration:  Fair  Recall:  Good  Fund of Knowledge:  Good  Language:  Good  Akathisia:  No  Handed:  Right  AIMS (if indicated):     Assets:  Communication Skills Desire for Improvement Financial Resources/Insurance Housing Physical Health  ADL's:  Intact  Cognition:  WNL  Sleep:       Established Problem, Stable/Improving (1),  Review of Last Therapy Session (1) and Review of Medication Regimen & Side Effects (2)  Assessment: Axis I: Schizophrenia chronic paranoid type  Axis II:  deferred  Axis III:  Patient Active Problem List   Diagnosis Date Noted  . HTN (hypertension) 05/07/2012  . Hyperlipidemia 05/07/2012    Plan: Patient is doing very well on Risperdal.  He has no tremors shakes or any EPS.  He is seeing his primary care physician next month and he will do blood work.  I ask him to have his blood work results faxed to [name].  I will continue Risperdal 3 mg twice a day.  Discussed medication side effects and benefits.  Recommended to call us back if his any question or any concern.  Follow-up in 3 months.   Channel Papandrea T., MD 07/03/2015

## 2015-08-11 DIAGNOSIS — E663 Overweight: Secondary | ICD-10-CM | POA: Insufficient documentation

## 2015-09-30 ENCOUNTER — Telehealth (HOSPITAL_COMMUNITY): Payer: Self-pay

## 2015-09-30 ENCOUNTER — Other Ambulatory Visit (HOSPITAL_COMMUNITY): Payer: Self-pay

## 2015-09-30 DIAGNOSIS — F200 Paranoid schizophrenia: Secondary | ICD-10-CM

## 2015-09-30 MED ORDER — RISPERIDONE 3 MG PO TABS
3.0000 mg | ORAL_TABLET | Freq: Two times a day (BID) | ORAL | 2 refills | Status: DC
Start: 2015-09-30 — End: 2015-12-02

## 2015-09-30 NOTE — Telephone Encounter (Signed)
Patient called today for a refill on his Risperdal, refill is appropriate and patient has an appointment next month. I called patient and let him know this had been done.

## 2015-10-06 ENCOUNTER — Ambulatory Visit (HOSPITAL_COMMUNITY): Payer: Self-pay | Admitting: Psychiatry

## 2015-11-18 ENCOUNTER — Ambulatory Visit (HOSPITAL_COMMUNITY): Payer: Self-pay | Admitting: Psychiatry

## 2015-12-02 ENCOUNTER — Ambulatory Visit (INDEPENDENT_AMBULATORY_CARE_PROVIDER_SITE_OTHER): Payer: Medicare Other | Admitting: Psychiatry

## 2015-12-02 ENCOUNTER — Encounter (HOSPITAL_COMMUNITY): Payer: Self-pay | Admitting: Psychiatry

## 2015-12-02 DIAGNOSIS — F200 Paranoid schizophrenia: Secondary | ICD-10-CM

## 2015-12-02 DIAGNOSIS — Z79899 Other long term (current) drug therapy: Secondary | ICD-10-CM | POA: Diagnosis not present

## 2015-12-02 MED ORDER — RISPERIDONE 3 MG PO TABS: 3.0000 mg | ORAL_TABLET | Freq: Two times a day (BID) | ORAL | 0 refills | Status: DC

## 2015-12-02 NOTE — Progress Notes (Signed)
[facility] [id number] [facility]  Patrick AntiguaJohn Moreno [id number] 60 y.o.  12/02/2015 10:10 AM  Chief Complaint: Followup and medication management.  History of Present Illness: Patrick RuizJohn came for her followup appointment.  He is compliant with his Risperdal 3 mg twice a day.  He has no side effects.  He has blood work in July with his primary care physician [name] and his happy with the results.  His LDL was 101, total cholesterol 152 , BUN 12, creatinine 0.94 , blood sugar 100 and his LFTs were normal.  His primary care physician is retiring and now he will see a new physician next time.  Overall he described his paranoia, irritability and hallucinations are well controlled with Risperdal 3 mg twice a day.  He sleeping good.  He is no longer working .  He wants to continue his current psychiatric medication.  He has no tremors, EPS or any other concerns.  His appetite is okay.  His energy level is good.  He denies any suicidal thoughts or homicidal thought.  He denies any feeling of hopelessness or worthlessness.  His appetite is okay.  His vital signs are stable.PS.  Suicidal Ideation: No Plan Formed: No Patient has means to carry out plan: No  Homicidal Ideation: No Plan Formed: No Patient has means to carry out plan: No  Review of Systems: Psychiatric: Agitation: No Hallucination: No Depressed Mood: No Insomnia: No Hypersomnia: No Altered Concentration: No Feels Worthless: No Grandiose Ideas: No Belief In Special Powers: No New/Increased Substance Abuse: No Compulsions: No  Neurologic: Headache: No Seizure: No Paresthesias: No  Medical History:  Patient has a history of hypertension, hyperlipidemia and obesity.  On July 2017 he had blood work which shows BUN 12, creatinine 0.94, total cholesterol 152, LDL 101.  His LFTs normal.      Outpatient Encounter Prescriptions as of 12/02/2015  Medication Sig Dispense Refill  . atorvastatin (LIPITOR) 40 MG tablet Take 40 mg  by mouth daily.      . hydrochlorothiazide (HYDRODIURIL) 12.5 MG tablet Take 12.5 mg by mouth daily.    . metoprolol (LOPRESSOR) 100 MG tablet Take 100 mg by mouth 2 (two) times daily.      . quinapril (ACCUPRIL) 40 MG tablet Take 40 mg by mouth at bedtime.      . risperiDONE (RISPERDAL) 3 MG tablet Take 1 tablet (3 mg total) by mouth 2 (two) times daily. 180 tablet 0  . [DISCONTINUED] risperiDONE (RISPERDAL) 3 MG tablet Take 1 tablet (3 mg total) by mouth 2 (two) times daily. 60 tablet 2   No facility-administered encounter medications on file as of 12/02/2015.     Past Psychiatric History/Hospitalization(s): Patient has been diagnosed with schizophrenia since [address]1991.  He had a psychotic episode when he was working in [place].  Patient denies any previous history of psychiatric admission treatment or any suicidal plan.  In the past he has taken Stelazine.  He's been doing very well on Risperdal. Anxiety: No Bipolar Disorder: No Depression: No Mania: No Psychosis: Yes Schizophrenia: Yes Personality Disorder: No Hospitalization for psychiatric illness: No History of Electroconvulsive Shock Therapy: No Prior Suicide Attempts: No  Physical Exam: Constitutional:  BP 116/72 (BP Location: Left Arm, Patient Position: Sitting, Cuff Size: Normal)   Pulse 68   Ht 5\' 8"  (1.727 m)   Wt 195 lb 6.4 oz (88.6 kg)   BMI 29.71 kg/m   General Appearance: alert, oriented, no acute distress and well nourished  Musculoskeletal: Strength & Muscle  Tone: within normal limits Gait & Station: normal Patient leans: N/A  Psychiatric Specialty Exam: Physical Exam  ROS  Blood pressure 116/72, pulse 68, height 5\' 8"  (1.727 m), weight 195 lb 6.4 oz (88.6 kg).Body mass index is 29.71 kg/m.  General Appearance: Casual  Eye Contact::  Good  Speech:  Slow  Volume:  Normal  Mood:  Euthymic  Affect:  Appropriate  Thought Process:  Logical  Orientation:  Full (Time, Place, and Person)  Thought Content:   WDL  Suicidal Thoughts:  No  Homicidal Thoughts:  No  Memory:  Immediate;   Fair Recent;   Fair Remote;   Fair  Judgement:  Good  Insight:  Good  Psychomotor Activity:  Normal  Concentration:  Fair  Recall:  Good  Fund of Knowledge:  Good  Language:  Good  Akathisia:  No  Handed:  Right  AIMS (if indicated):     Assets:  Communication Skills Desire for Improvement Financial Resources/Insurance Housing Physical Health  ADL's:  Intact  Cognition:  WNL  Sleep:       Established Problem, Stable/Improving (1), Review or order clinical lab tests (1), Review of Last Therapy Session (1) and Review of Medication Regimen & Side Effects (2)  Assessment: Axis I: Schizophrenia chronic paranoid type  Axis II:  deferred  Axis III:  Patient Active Problem List  Diagnosis  . HTN (hypertension)  . Hyperlipidemia    Plan: I reviewed his blood work results which he brought today with him he does not want to change his medication.  I will continue Risperdal 3 mg twice a day.  Discussed medication side effects and benefits.  Recommended to call us back if his any question or any concern.  Follow-up in 3 months.   Patrick Moreno T., MD 12/02/2015

## 2016-03-03 ENCOUNTER — Encounter (HOSPITAL_COMMUNITY): Payer: Self-pay | Admitting: Psychiatry

## 2016-03-03 ENCOUNTER — Encounter (INDEPENDENT_AMBULATORY_CARE_PROVIDER_SITE_OTHER): Payer: Self-pay

## 2016-03-03 ENCOUNTER — Ambulatory Visit (INDEPENDENT_AMBULATORY_CARE_PROVIDER_SITE_OTHER): Payer: Medicare Other | Admitting: Psychiatry

## 2016-03-03 VITALS — BP 122/74 | HR 70 | Ht 68.0 in | Wt 198.7 lb

## 2016-03-03 DIAGNOSIS — F200 Paranoid schizophrenia: Secondary | ICD-10-CM

## 2016-03-03 DIAGNOSIS — Z79899 Other long term (current) drug therapy: Secondary | ICD-10-CM | POA: Diagnosis not present

## 2016-03-03 MED ORDER — RISPERIDONE 3 MG PO TABS: 3.0000 mg | ORAL_TABLET | Freq: Two times a day (BID) | ORAL | 0 refills | Status: DC

## 2016-03-03 NOTE — Progress Notes (Signed)
BH MD/PA/NP OP Progress Note  03/03/2016 9:23 AM Patrick Moreno  MRN:  [id number]  Chief Complaint:  Subjective:  I'm doing good.  I'm taking medication.  HPI: Patrick Moreno came for his follow-up appointment.  He is taking his medication and reported no side effects.  He has no tremors shakes or any EPS.  He sleeping good.  He spending most of the time reading books and enjoying time in [employer].  He tried to go back part-time but due to his chronic pain in his leg he was unable to stand for long time.  He denies any paranoia, hallucination, irritability, anger or any suicidal thoughts.  We have discussed lowering his medication but he is very reluctant and like to continue Risperdal 3 mg twice a day.  He had a quiet Christmas and he went to visit his brother.  Patient denies drinking alcohol or using any illegal substances.  He lives by himself.  He is never married and he has no children.  His appetite is okay.  His vital signs are stable.  Visit Diagnosis:    ICD-9-CM ICD-10-CM   1. Schizophrenia, paranoid (HCC) 295.30 F20.0 risperiDONE (RISPERDAL) 3 MG tablet    Past Psychiatric History: Reviewed. Patient has been diagnosed with schizophrenia since 1991.  He had a psychotic episode when he was working in [place].  Patient denies any previous history of psychiatric admission treatment or any suicidal plan.  In the past he has taken Stelazine.    Past Medical History:  He see Dr. Doristine Counter.  In July 2017 he has blood work.  His LDL was 101, total cholesterol 152, BUN 12, creatinine 0.94, blood sugar 100.  His LFTs were normal. Past Medical History:  Diagnosis Date  . HTN (hypertension)   . HTN (hypertension)   . Hyperlipemia   . Schizophrenia (HCC)    No past surgical history on file.  Family Psychiatric History: Reviewed.  Family History: No family history on file.  Social History:  Social History   Social History  . Marital status: Single    Spouse name: N/A  . Number of  children: N/A  . Years of education: N/A   Social History Main Topics  . Smoking status: Never Smoker  . Smokeless tobacco: Never Used  . Alcohol use No  . Drug use: No  . Sexual activity: Not Currently   Other Topics Concern  . None   Social History Narrative  . None    Allergies: No Known Allergies  Metabolic Disorder Labs: No results found for: HGBA1C, MPG No results found for: PROLACTIN No results found for: CHOL, TRIG, HDL, CHOLHDL, VLDL, LDLCALC   Current Medications: Current Outpatient Prescriptions  Medication Sig Dispense Refill  . atorvastatin (LIPITOR) 40 MG tablet Take 40 mg by mouth daily.      . hydrochlorothiazide (HYDRODIURIL) 12.5 MG tablet Take 12.5 mg by mouth daily.    . metoprolol (LOPRESSOR) 100 MG tablet Take 100 mg by mouth 2 (two) times daily.      . quinapril (ACCUPRIL) 40 MG tablet Take 40 mg by mouth at bedtime.      . risperiDONE (RISPERDAL) 3 MG tablet Take 1 tablet (3 mg total) by mouth 2 (two) times daily. 180 tablet 0   No current facility-administered medications for this visit.     Neurologic: Headache: No Seizure: No Paresthesias: No  Musculoskeletal: Strength & Muscle Tone: within normal limits Gait & Station: normal Patient leans: N/A  Psychiatric Specialty Exam: Review of Systems  Constitutional: Negative.   HENT: Negative.   Musculoskeletal:       Leg pain  Skin: Negative.   Neurological: Negative.     Blood pressure 122/74, pulse 70, height 5\' 8"  (1.727 m), weight 198 lb 11.2 oz (90.1 kg).Body mass index is 30.21 kg/m.  General Appearance: Casual  Eye Contact:  Fair  Speech:  Clear and Coherent  Volume:  Normal  Mood:  Euthymic  Affect:  Appropriate and Congruent  Thought Process:  Goal Directed  Orientation:  Full (Time, Place, and Person)  Thought Content: WDL and Logical   Suicidal Thoughts:  No  Homicidal Thoughts:  No  Memory:  Immediate;   Good Recent;   Good Remote;   Good  Judgement:  Good   Insight:  Good  Psychomotor Activity:  Normal  Concentration:  Concentration: Fair and Attention Span: Fair  Recall:  [employer]  Fund of Knowledge: Good  Language: Good  Akathisia:  No  Handed:  Right  AIMS (if indicated):  0  Assets:  Communication Skills Desire for Improvement Housing Resilience  ADL's:  Intact  Cognition: WNL  Sleep:  ok   Assessment: Schizophrenia chronic paranoid type  Plan: Patient is a stable on his current Risperdal dose.  He has no tremors shakes or any EPS.  He does not want to reduce the dose.  Recommended to call us back if he has any question, concern if he feel worsening of the symptom.  I will continue Risperdal 3 mg twice a day.  Follow-up in 3 months.  Discuss safety plan that anytime having active suicidal thoughts or homicidal thoughts and he need to call 911 or the local emergency room.  Vonn Sliger T., MD 03/03/2016, 9:23 AM

## 2016-05-26 ENCOUNTER — Encounter (HOSPITAL_COMMUNITY): Payer: Self-pay | Admitting: Psychiatry

## 2016-05-26 ENCOUNTER — Ambulatory Visit (INDEPENDENT_AMBULATORY_CARE_PROVIDER_SITE_OTHER): Payer: Medicare Other | Admitting: Psychiatry

## 2016-05-26 DIAGNOSIS — F200 Paranoid schizophrenia: Secondary | ICD-10-CM

## 2016-05-26 DIAGNOSIS — Z79899 Other long term (current) drug therapy: Secondary | ICD-10-CM

## 2016-05-26 MED ORDER — RISPERIDONE 3 MG PO TABS: 3.0000 mg | ORAL_TABLET | Freq: Two times a day (BID) | ORAL | 0 refills | Status: DC

## 2016-05-26 NOTE — Progress Notes (Signed)
BH MD/PA/NP OP Progress Note  05/26/2016 10:35 AM Patrick Moreno  MRN:  [id number]  Chief Complaint:  Subjective:  I'm doing good.  Her medicines working.  HPI: Patrick Moreno came for his follow-up appointment.  He is taking his medication as prescribed.  He denies any irritability, mania, psychosis or any hallucination.  He sleeping good.  He has no tremors or shakes.  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  Due to his chronic pain in his leg he is unable to stand for long time and unable to work.  Patient does not want to change his medication since it is working very well.  Patient is scheduled to see his primary care physician next week.  Patient is happy because her niece is pregnant.  Patient never married and he has no children.  Patient denies any drug use.  His appetite is okay.  He is trying to lose weight and his energy level is good.  His vital signs are stable.  Visit Diagnosis:    ICD-9-CM ICD-10-CM   1. Schizophrenia, paranoid (HCC) 295.30 F20.0 risperiDONE (RISPERDAL) 3 MG tablet    Past Psychiatric History: Reviewed. Patient has been diagnosed with schizophrenia since 1991. He had a psychotic episode when he was working in [place]. Patient denies any previous history of psychiatric admission treatment or any suicidal plan. In the past he has taken Stelazine.   Past Medical History:  Past Medical History:  Diagnosis Date  . HTN (hypertension)   . HTN (hypertension)   . Hyperlipemia   . Schizophrenia (HCC)    No past surgical history on file.  Family Psychiatric History: Reviewed.  Family History: No family history on file.  Social History:  Social History   Social History  . Marital status: Single    Spouse name: N/A  . Number of children: N/A  . Years of education: N/A   Social History Main Topics  . Smoking status: Never Smoker  . Smokeless tobacco: Never Used  . Alcohol use No  . Drug use: No  . Sexual activity: Not  Currently   Other Topics Concern  . Not on file   Social History Narrative  . No narrative on file    Allergies: No Known Allergies  Metabolic Disorder Labs: No results found for: HGBA1C, MPG No results found for: PROLACTIN No results found for: CHOL, TRIG, HDL, CHOLHDL, VLDL, LDLCALC   Current Medications: Current Outpatient Prescriptions  Medication Sig Dispense Refill  . atorvastatin (LIPITOR) 40 MG tablet Take 40 mg by mouth daily.      . hydrochlorothiazide (HYDRODIURIL) 12.5 MG tablet Take 12.5 mg by mouth daily.    . metoprolol (LOPRESSOR) 100 MG tablet Take 100 mg by mouth 2 (two) times daily.      . quinapril (ACCUPRIL) 40 MG tablet Take 40 mg by mouth at bedtime.      . risperiDONE (RISPERDAL) 3 MG tablet Take 1 tablet (3 mg total) by mouth 2 (two) times daily. 180 tablet 0   No current facility-administered medications for this visit.     Neurologic: Headache: No Seizure: No Paresthesias: No  Musculoskeletal: Strength & Muscle Tone: within normal limits Gait & Station: normal Patient leans: N/A  Psychiatric Specialty Exam: ROS  Blood pressure 130/78, pulse 67, height  (1.727 m), weight 192 lb 12.8 oz (87.5 kg).There is no height or weight on file to calculate BMI.  General Appearance: Casual  Eye Contact:  Good  Speech:  Slow  Volume:  Normal  Mood:  Anxious  Affect:  Appropriate  Thought Process:  Goal Directed  Orientation:  Full (Time, Place, and Person)  Thought Content: WDL and Logical   Suicidal Thoughts:  No  Homicidal Thoughts:  No  Memory:  Immediate;   Good Recent;   Good Remote;   Good  Judgement:  Good  Insight:  Good  Psychomotor Activity:  Normal  Concentration:  Concentration: Good and Attention Span: Fair  Recall:  Good  Fund of Knowledge: Good  Language: Good  Akathisia:  No  Handed:  Right  AIMS (if indicated):  0  Assets:  Communication Skills Desire for Improvement Housing Resilience  ADL's:  Intact  Cognition:  WNL  Sleep:  ok    Assessment: Schizophrenia chronic paranoid type.  Plan: Patient is a stable on his current psychiatric medication.  He does not want to lower the dose because he feel it is working very well.  I will continue Risperdal 3 mg twice a day.  He has no tremors, shakes or any EPS.  Recommended to call us back if he has any question or any concern.  Follow-up in 3 months.  Davine Sweney T., MD 05/26/2016, 10:35 AM

## 2016-06-02 DIAGNOSIS — I10 Essential (primary) hypertension: Secondary | ICD-10-CM | POA: Diagnosis not present

## 2016-06-02 DIAGNOSIS — E7800 Pure hypercholesterolemia, unspecified: Secondary | ICD-10-CM | POA: Diagnosis not present

## 2016-06-06 DIAGNOSIS — E7800 Pure hypercholesterolemia, unspecified: Secondary | ICD-10-CM | POA: Diagnosis not present

## 2016-06-06 DIAGNOSIS — I10 Essential (primary) hypertension: Secondary | ICD-10-CM | POA: Diagnosis not present

## 2016-06-06 DIAGNOSIS — E663 Overweight: Secondary | ICD-10-CM | POA: Diagnosis not present

## 2016-08-24 DIAGNOSIS — M1990 Unspecified osteoarthritis, unspecified site: Secondary | ICD-10-CM

## 2016-08-24 DIAGNOSIS — M1612 Unilateral primary osteoarthritis, left hip: Secondary | ICD-10-CM | POA: Diagnosis not present

## 2016-08-24 DIAGNOSIS — G8929 Other chronic pain: Secondary | ICD-10-CM | POA: Diagnosis not present

## 2016-08-24 DIAGNOSIS — M25552 Pain in left hip: Secondary | ICD-10-CM | POA: Diagnosis not present

## 2016-08-24 HISTORY — DX: Unspecified osteoarthritis, unspecified site: M19.90

## 2016-08-25 ENCOUNTER — Ambulatory Visit (INDEPENDENT_AMBULATORY_CARE_PROVIDER_SITE_OTHER): Payer: Medicare Other | Admitting: Psychiatry

## 2016-08-25 ENCOUNTER — Encounter (HOSPITAL_COMMUNITY): Payer: Self-pay | Admitting: Psychiatry

## 2016-08-25 DIAGNOSIS — F200 Paranoid schizophrenia: Secondary | ICD-10-CM | POA: Diagnosis not present

## 2016-08-25 MED ORDER — RISPERIDONE 3 MG PO TABS: 3.0000 mg | ORAL_TABLET | Freq: Two times a day (BID) | ORAL | 1 refills | Status: DC

## 2016-08-25 NOTE — Progress Notes (Signed)
BH MD/PA/NP OP Progress Note  08/25/2016 10:28 AM Patrick AntiguaJohn Moreno  MRN:  [id number]  Chief Complaint:  Chief Complaint    Follow-up     Subjective: Medication management and follow-up.   HPI: Patient came for his follow-up appointment.  He is taking his medication and reported no side effects.  Recently he is complaining of difficulty hearing because his hearing aid not working properly.  He is looking for ENT specialist.  He also blaming of joint pain and he was diagnosed with arthritis.  He is taking over-the-counter pain medication.  Patient denies any irritability, anger, mania or any psychosis.  He sleeping good.  He has no tremors or shakes.  He endorse medicine helping his thinking and he denies any paranoia, hallucination or any disorganized thinking.  His appetite is okay.  He continues to [place] [employer] [facility] [place].  Patient does not want to change his medication.  He is seeing his primary care physician and there were no changes in his medication.  Patient denies drinking alcohol or using any illegal substances.  His appetite is good.  His vital signs are stable.  Visit Diagnosis:  No diagnosis found.  Past Psychiatric History: Reviewed. Patient has been diagnosed with schizophrenia since 1991. He had a psychotic episode when he was working in [place]. Patient denies any previous history of psychiatric admission treatment or any suicidal plan. In the past he has taken Stelazine.   Past Medical History:  Past Medical History:  Diagnosis Date  . Arthritis 08/24/2016   In Left Hip  . HTN (hypertension)   . HTN (hypertension)   . Hyperlipemia   . Schizophrenia (HCC)    History reviewed. No pertinent surgical history.  Family Psychiatric History: Reviewed.  Family History: History reviewed. No pertinent family history.  Social History:  Social History   Social History  . Marital status: Single    Spouse name: N/A  . Number of children:  N/A  . Years of education: N/A   Social History Main Topics  . Smoking status: Never Smoker  . Smokeless tobacco: Never Used  . Alcohol use 0.0 oz/week     Comment: Occasional beer once a month  . Drug use: No  . Sexual activity: Not Currently   Other Topics Concern  . None   Social History Narrative  . None    Allergies: No Known Allergies  Metabolic Disorder Labs: No results found for: HGBA1C, MPG No results found for: PROLACTIN No results found for: CHOL, TRIG, HDL, CHOLHDL, VLDL, LDLCALC   Current Medications: Current Outpatient Prescriptions  Medication Sig Dispense Refill  . atorvastatin (LIPITOR) 40 MG tablet Take 40 mg by mouth daily.      . hydrochlorothiazide (HYDRODIURIL) 12.5 MG tablet Take 12.5 mg by mouth daily.    . metoprolol (LOPRESSOR) 100 MG tablet Take 100 mg by mouth 2 (two) times daily.      . naproxen sodium (ALEVE) 220 MG tablet Take 220 mg by mouth 2 (two) times daily with a meal.    . quinapril (ACCUPRIL) 40 MG tablet Take 40 mg by mouth at bedtime.      . risperiDONE (RISPERDAL) 3 MG tablet Take 1 tablet (3 mg total) by mouth 2 (two) times daily. 180 tablet 0   No current facility-administered medications for this visit.     Neurologic: Headache: No Seizure: No Paresthesias: No  Musculoskeletal: Strength & Muscle Tone: within normal limits Gait & Station: normal Patient leans:  N/A  Psychiatric Specialty Exam: ROS  Blood pressure 124/78, pulse (!) 56, height 5\' 7"  (1.702 m), weight 188 lb (85.3 kg), SpO2 98 %.Body mass index is 29.44 kg/m.  General Appearance: Casual  Eye Contact:  Fair  Speech:  Normal Rate  Volume:  Normal  Mood:  Euthymic  Affect:  Appropriate  Thought Process:  Goal Directed  Orientation:  Full (Time, Place, and Person)  Thought Content: WDL and Logical   Suicidal Thoughts:  No  Homicidal Thoughts:  No  Memory:  Immediate;   Good Recent;   Good Remote;   Good  Judgement:  Good  Insight:  Good   Psychomotor Activity:  Normal  Concentration:  Concentration: Good and Attention Span: Fair  Recall:  Good  Fund of Knowledge: Good  Language: Good  Akathisia:  No  Handed:  Right  AIMS (if indicated):  0  Assets:  Communication Skills Desire for Improvement Housing Resilience  ADL's:  Intact  Cognition: WNL  Sleep:  ok    Assessment: Schizophrenia , chronic paranoid type.    Plan: Patient is a stable on his current psychiatric medication.  I will continue Risperdal 3 mg twice a day.  He has no tremors shakes or any EPS.  Recommended to call us back if he has any question or any concern.  Follow-up in 6 months.  Cheyane Ayon T., MD 08/25/2016, 10:28 AM

## 2016-11-29 DIAGNOSIS — H903 Sensorineural hearing loss, bilateral: Secondary | ICD-10-CM | POA: Diagnosis not present

## 2016-11-29 DIAGNOSIS — H6123 Impacted cerumen, bilateral: Secondary | ICD-10-CM | POA: Diagnosis not present

## 2017-02-17 ENCOUNTER — Other Ambulatory Visit (HOSPITAL_COMMUNITY): Payer: Self-pay

## 2017-02-17 DIAGNOSIS — F200 Paranoid schizophrenia: Secondary | ICD-10-CM

## 2017-02-17 MED ORDER — RISPERIDONE 3 MG PO TABS: 3.0000 mg | ORAL_TABLET | Freq: Two times a day (BID) | ORAL | 0 refills | Status: DC

## 2017-02-27 ENCOUNTER — Ambulatory Visit (INDEPENDENT_AMBULATORY_CARE_PROVIDER_SITE_OTHER): Payer: Medicare Other | Admitting: Psychiatry

## 2017-02-27 ENCOUNTER — Encounter (HOSPITAL_COMMUNITY): Payer: Self-pay | Admitting: Psychiatry

## 2017-02-27 DIAGNOSIS — Z79899 Other long term (current) drug therapy: Secondary | ICD-10-CM

## 2017-02-27 DIAGNOSIS — F200 Paranoid schizophrenia: Secondary | ICD-10-CM | POA: Diagnosis not present

## 2017-02-27 MED ORDER — RISPERIDONE 3 MG PO TABS: 3.0000 mg | ORAL_TABLET | Freq: Two times a day (BID) | ORAL | 1 refills | Status: DC

## 2017-02-27 NOTE — Progress Notes (Signed)
BH MD/PA/NP OP Progress Note  02/27/2017 8:57 AM Patrick Moreno  MRN:  [id number]  Chief Complaint: I am doing good.  I am taking the medication.  I apologized missing last appointment.  HPI: Patient came for his follow-up appointment.  He missed last appointment but he is taking his medication as prescribed.  He had a good Christmas.  He spent Christmas with his brother.  He also saw ENT specialist and his hearing is much improved.  He endorsed he required cleaning of his ear and now things are much better.  He is sleeping good.  He denies any irritability, anger, mania or any psychosis.  He denies any crying spells or any feeling of hopelessness or worthlessness.  He is taking Risperdal 3 mg twice a day.  He has no tremors shakes or any EPS.  He will see his primary care physician Dr. Benedetto Goad in May for physical and blood work.  His energy level is good.  His appetite is okay.  Visit Diagnosis:    ICD-10-CM   1. Schizophrenia, paranoid (HCC) F20.0 risperiDONE (RISPERDAL) 3 MG tablet    Past Psychiatric History: Viewed. Patient has been diagnosed with schizophrenia since 1991. He had a psychotic episode when he was working in [place]. Patient denies any previous history of psychiatric admission treatment or any suicidal plan. In the past he has taken Stelazine.   Past Medical History:  Past Medical History:  Diagnosis Date  . Arthritis 08/24/2016   In Left Hip  . HTN (hypertension)   . HTN (hypertension)   . Hyperlipemia   . Schizophrenia (HCC)    History reviewed. No pertinent surgical history.  Family Psychiatric History: Reviewed.  Family History: History reviewed. No pertinent family history.  Social History:  Social History   Socioeconomic History  . Marital status: Single    Spouse name: None  . Number of children: None  . Years of education: None  . Highest education level: None  Social Needs  . Financial resource strain: None  . Food insecurity - worry:  None  . Food insecurity - inability: None  . Transportation needs - medical: None  . Transportation needs - non-medical: None  Occupational History  . None  Tobacco Use  . Smoking status: Never Smoker  . Smokeless tobacco: Never Used  Substance and Sexual Activity  . Alcohol use: Yes    Alcohol/week: 0.0 oz    Comment: Occasional beer once a month  . Drug use: No  . Sexual activity: Not Currently  Other Topics Concern  . None  Social History Narrative  . None    Allergies: No Known Allergies  Metabolic Disorder Labs: No results found for: HGBA1C, MPG No results found for: PROLACTIN No results found for: CHOL, TRIG, HDL, CHOLHDL, VLDL, LDLCALC No results found for: TSH  Therapeutic Level Labs: No results found for: LITHIUM No results found for: VALPROATE No components found for:  CBMZ  Current Medications: Current Outpatient Medications  Medication Sig Dispense Refill  . atorvastatin (LIPITOR) 40 MG tablet Take 40 mg by mouth daily.      . hydrochlorothiazide (HYDRODIURIL) 12.5 MG tablet Take 12.5 mg by mouth daily.    . metoprolol (LOPRESSOR) 100 MG tablet Take 100 mg by mouth 2 (two) times daily.      . naproxen sodium (ALEVE) 220 MG tablet Take 220 mg by mouth 2 (two) times daily with a meal.    . quinapril (ACCUPRIL) 40 MG tablet Take 40 mg by mouth  at bedtime.      . risperiDONE (RISPERDAL) 3 MG tablet Take 1 tablet (3 mg total) by mouth 2 (two) times daily. 60 tablet 0   No current facility-administered medications for this visit.      Musculoskeletal: Strength & Muscle Tone: within normal limits Gait & Station: normal Patient leans: N/A  Psychiatric Specialty Exam: ROS  Blood pressure 126/72, pulse 64, height 5\' 8"  (1.727 m), weight 187 lb (84.8 kg).Body mass index is 28.43 kg/m.  General Appearance: Casual  Eye Contact:  Fair  Speech:  Clear and Coherent  Volume:  Normal  Mood:  Euthymic  Affect:  Appropriate  Thought Process:  Goal Directed   Orientation:  Full (Time, Place, and Person)  Thought Content: Logical   Suicidal Thoughts:  No  Homicidal Thoughts:  No  Memory:  Immediate;   Good Recent;   Good Remote;   Good  Judgement:  Good  Insight:  Good  Psychomotor Activity:  Normal  Concentration:  Concentration: Good and Attention Span: Good  Recall:  Good  Fund of Knowledge: Good  Language: Good  Akathisia:  No  Handed:  Right  AIMS (if indicated): not done  Assets:  Communication Skills Desire for Improvement Housing Resilience Social Support  ADL's:  Intact  Cognition: WNL  Sleep:  Good   Screenings:   Assessment and Plan: Schizophrenia chronic paranoid type.  Patient doing better on his current medication.  We discussed and reinforced regular follow-up for continuity of care.  Discussed healthy lifestyle and encourage to watch his calorie intake and regular exercise.  He has no tremors shakes or any EPS.  Continue Risperdal 3 mg twice a day.  Reminded that have his blood work results faxed to us in May.  Follow-up in 6 months.   Cleotis NipperSyed T Stevana Dufner, MD 02/27/2017, 8:57 AM

## 2017-04-04 DIAGNOSIS — H35033 Hypertensive retinopathy, bilateral: Secondary | ICD-10-CM | POA: Diagnosis not present

## 2017-05-04 DIAGNOSIS — I10 Essential (primary) hypertension: Secondary | ICD-10-CM | POA: Diagnosis not present

## 2017-05-04 DIAGNOSIS — E7800 Pure hypercholesterolemia, unspecified: Secondary | ICD-10-CM | POA: Diagnosis not present

## 2017-05-10 DIAGNOSIS — M1612 Unilateral primary osteoarthritis, left hip: Secondary | ICD-10-CM | POA: Insufficient documentation

## 2017-05-10 DIAGNOSIS — R7301 Impaired fasting glucose: Secondary | ICD-10-CM | POA: Insufficient documentation

## 2017-05-10 DIAGNOSIS — E7800 Pure hypercholesterolemia, unspecified: Secondary | ICD-10-CM | POA: Diagnosis not present

## 2017-05-10 DIAGNOSIS — I10 Essential (primary) hypertension: Secondary | ICD-10-CM | POA: Diagnosis not present

## 2017-07-27 DIAGNOSIS — H6123 Impacted cerumen, bilateral: Secondary | ICD-10-CM | POA: Diagnosis not present

## 2017-07-27 DIAGNOSIS — H903 Sensorineural hearing loss, bilateral: Secondary | ICD-10-CM | POA: Diagnosis not present

## 2017-08-09 DIAGNOSIS — I10 Essential (primary) hypertension: Secondary | ICD-10-CM | POA: Diagnosis not present

## 2017-08-09 DIAGNOSIS — E7800 Pure hypercholesterolemia, unspecified: Secondary | ICD-10-CM | POA: Diagnosis not present

## 2017-08-28 ENCOUNTER — Ambulatory Visit (HOSPITAL_COMMUNITY): Payer: Self-pay | Admitting: Psychiatry

## 2017-09-11 ENCOUNTER — Other Ambulatory Visit (HOSPITAL_COMMUNITY): Payer: Self-pay

## 2017-09-11 DIAGNOSIS — F200 Paranoid schizophrenia: Secondary | ICD-10-CM

## 2017-09-11 MED ORDER — RISPERIDONE 3 MG PO TABS: 3.0000 mg | ORAL_TABLET | Freq: Two times a day (BID) | ORAL | 0 refills | Status: DC

## 2017-10-06 ENCOUNTER — Ambulatory Visit (HOSPITAL_COMMUNITY): Payer: Self-pay | Admitting: Psychiatry

## 2017-10-26 DIAGNOSIS — E7800 Pure hypercholesterolemia, unspecified: Secondary | ICD-10-CM | POA: Diagnosis not present

## 2017-10-26 DIAGNOSIS — I10 Essential (primary) hypertension: Secondary | ICD-10-CM | POA: Diagnosis not present

## 2017-10-30 DIAGNOSIS — I10 Essential (primary) hypertension: Secondary | ICD-10-CM | POA: Diagnosis not present

## 2017-10-30 DIAGNOSIS — E7800 Pure hypercholesterolemia, unspecified: Secondary | ICD-10-CM | POA: Diagnosis not present

## 2017-10-30 DIAGNOSIS — Z23 Encounter for immunization: Secondary | ICD-10-CM | POA: Diagnosis not present

## 2017-11-01 ENCOUNTER — Ambulatory Visit (HOSPITAL_COMMUNITY): Payer: Self-pay | Admitting: Psychiatry

## 2017-11-21 ENCOUNTER — Ambulatory Visit (HOSPITAL_COMMUNITY): Payer: Medicare Other | Admitting: Psychiatry

## 2017-12-01 ENCOUNTER — Encounter (HOSPITAL_COMMUNITY): Payer: Self-pay | Admitting: Psychiatry

## 2017-12-01 ENCOUNTER — Ambulatory Visit (INDEPENDENT_AMBULATORY_CARE_PROVIDER_SITE_OTHER): Payer: Medicare Other | Admitting: Psychiatry

## 2017-12-01 VITALS — BP 116/74 | HR 74 | Ht 65.5 in | Wt 190.0 lb

## 2017-12-01 DIAGNOSIS — F200 Paranoid schizophrenia: Secondary | ICD-10-CM

## 2017-12-01 MED ORDER — RISPERIDONE 3 MG PO TABS: 3.0000 mg | ORAL_TABLET | Freq: Two times a day (BID) | ORAL | 1 refills | Status: DC

## 2017-12-01 NOTE — Progress Notes (Signed)
BH MD/PA/NP OP Progress Note  12/01/2017 8:39 AM Patrick Moreno  MRN:  [id number]  Chief Complaint: I am doing okay.  I like new medication.  HPI: Patrick Moreno came for his follow-up appointment.  He is taking Risperdal 3 mg twice a day and denies any recent paranoia, hallucination or any delusions.  Recently seen his primary care physician and he has blood work.  His CBC shows slightly decreased hemoglobin and hematocrit otherwise normal.  His CMP shows normal LFT, blood sugar, sodium potassium and BUN.  His lipid profile shows LDL 225 but he is no longer taking cholesterol-lowering medication.  He told his physician took him off from statins because of the side effects.  He started walking every day and he feels good.  His energy level is good.  Recently his knees came from [name] in the summer and he had a good time with her.  Patient denies any mania, irritability, suicidal thoughts or homicidal thought.  He reported his thinking is clear and organized.  He has no tremors, shakes or any EPS.  Patient see Dr. Benedetto Moreno for his physical and annual checkup.  Patient denies drinking or using any illegal substances.  His energy level is good.  His appetite is okay.  His vital signs are stable.  Visit Diagnosis:    ICD-10-CM   1. Schizophrenia, paranoid (HCC) F20.0 risperiDONE (RISPERDAL) 3 MG tablet    Past Psychiatric History: Reviewed Patient has been diagnosed with schizophrenia since 1991. He had a psychotic episode when he was working in [place]. Patient denies any previous history of psychiatric admission treatment or any suicidal plan. In the past he has taken Stelazine.   Past Medical History:  Past Medical History:  Diagnosis Date  . Arthritis 08/24/2016   In Left Hip  . HTN (hypertension)   . HTN (hypertension)   . Hyperlipemia   . Schizophrenia (HCC)    No past surgical history on file.  Family Psychiatric History: Reviewed  Family History: No family history on  file.  Social History:  Social History   Socioeconomic History  . Marital status: Single    Spouse name: Not on file  . Number of children: Not on file  . Years of education: Not on file  . Highest education level: Not on file  Occupational History  . Not on file  Social Needs  . Financial resource strain: Not on file  . Food insecurity:    Worry: Not on file    Inability: Not on file  . Transportation needs:    Medical: Not on file    Non-medical: Not on file  Tobacco Use  . Smoking status: Never Smoker  . Smokeless tobacco: Never Used  Substance and Sexual Activity  . Alcohol use: Yes    Alcohol/week: 4.0 standard drinks    Types: 4 Cans of beer per week  . Drug use: No  . Sexual activity: Not Currently  Lifestyle  . Physical activity:    Days per week: Not on file    Minutes per session: Not on file  . Stress: Not on file  Relationships  . Social connections:    Talks on phone: Not on file    Gets together: Not on file    Attends religious service: Not on file    Active member of club or organization: Not on file    Attends meetings of clubs or organizations: Not on file    Relationship status: Not on file  Other Topics Concern  .  Not on file  Social History Narrative  . Not on file    Allergies: No Known Allergies  Metabolic Disorder Labs: No results found for: HGBA1C, MPG No results found for: PROLACTIN No results found for: CHOL, TRIG, HDL, CHOLHDL, VLDL, LDLCALC No results found for: TSH  Therapeutic Level Labs: No results found for: LITHIUM No results found for: VALPROATE No components found for:  CBMZ  Current Medications: Current Outpatient Medications  Medication Sig Dispense Refill  . hydrochlorothiazide (HYDRODIURIL) 12.5 MG tablet Take 12.5 mg by mouth daily.    . metoprolol (LOPRESSOR) 100 MG tablet Take 100 mg by mouth 2 (two) times daily.      . naproxen sodium (ALEVE) 220 MG tablet Take 220 mg by mouth 2 (two) times daily with a  meal.    . quinapril (ACCUPRIL) 40 MG tablet Take 40 mg by mouth at bedtime.      . risperiDONE (RISPERDAL) 3 MG tablet Take 1 tablet (3 mg total) by mouth 2 (two) times daily. 180 tablet 0   No current facility-administered medications for this visit.      Musculoskeletal: Strength & Muscle Tone: within normal limits Gait & Station: normal Patient leans: N/A  Psychiatric Specialty Exam: ROS  Blood pressure 116/74, pulse 74, height 5' 5.5" (1.664 m), weight 190 lb (86.2 kg), SpO2 98 %.Body mass index is 31.14 kg/m.  General Appearance: Casual  Eye Contact:  Fair  Speech:  Slow  Volume:  Normal  Mood:  Euthymic  Affect:  Congruent  Thought Process:  Goal Directed  Orientation:  Full (Time, Place, and Person)  Thought Content: Logical   Suicidal Thoughts:  No  Homicidal Thoughts:  No  Memory:  Immediate;   Good Recent;   Good Remote;   Good  Judgement:  Good  Insight:  Good  Psychomotor Activity:  Normal  Concentration:  Concentration: Fair and Attention Span: Fair  Recall:  Good  Fund of Knowledge: Good  Language: Good  Akathisia:  No  Handed:  Right  AIMS (if indicated): not done  Assets:  Communication Skills Desire for Improvement Housing Resilience  ADL's:  Intact  Cognition: WNL  Sleep:  Good   Screenings:   Assessment and Plan: Schizophrenia chronic paranoid type.  Patient doing better on his current medication.  I reviewed blood work results which was done October 26, 2017 by his primary care physician.  His CBC, CMP is normal and his lipid profile shows LDL to 25.  Encourage healthy lifestyle and watch his calorie intake.  I suggested he should try taking fish oil since he cannot take statins.  Patient does not want to change or lower his Risperdal.  He has been stable on this medication for a while and is scared to reduce the dose.  Continue Risperdal 3 mg twice a day.  He has no tremors, shakes or any EPS.  He is not interested in counseling.   Recommended to call us back if he has any question, concern or if he feels worsening of the symptoms.  Follow-up in 6 months.   Patrick Nipper, MD 12/01/2017, 8:39 AM

## 2018-02-15 DIAGNOSIS — H903 Sensorineural hearing loss, bilateral: Secondary | ICD-10-CM | POA: Diagnosis not present

## 2018-02-15 DIAGNOSIS — H6123 Impacted cerumen, bilateral: Secondary | ICD-10-CM | POA: Diagnosis not present

## 2018-05-24 DIAGNOSIS — M1612 Unilateral primary osteoarthritis, left hip: Secondary | ICD-10-CM | POA: Diagnosis not present

## 2018-05-24 DIAGNOSIS — M25552 Pain in left hip: Secondary | ICD-10-CM | POA: Diagnosis not present

## 2018-05-31 ENCOUNTER — Ambulatory Visit (INDEPENDENT_AMBULATORY_CARE_PROVIDER_SITE_OTHER): Payer: Medicare Other | Admitting: Psychiatry

## 2018-05-31 ENCOUNTER — Encounter (HOSPITAL_COMMUNITY): Payer: Self-pay | Admitting: Psychiatry

## 2018-05-31 ENCOUNTER — Other Ambulatory Visit: Payer: Self-pay

## 2018-05-31 DIAGNOSIS — F200 Paranoid schizophrenia: Secondary | ICD-10-CM

## 2018-05-31 MED ORDER — RISPERIDONE 3 MG PO TABS: 3.0000 mg | ORAL_TABLET | Freq: Two times a day (BID) | ORAL | 1 refills | Status: DC

## 2018-05-31 NOTE — Progress Notes (Signed)
Virtual Visit via Telephone Note  I connected with Patrick Moreno on 05/31/18 at  8:20 AM EDT by telephone and verified that I am speaking with the correct person using two identifiers.   I discussed the limitations, risks, security and privacy concerns of performing an evaluation and management service by telephone and the availability of in person appointments. I also discussed with the patient that there may be a patient responsible charge related to this service. The patient expressed understanding and agreed to proceed.   History of Present Illness: Patient evaluated to clinical position.  He is taking Risperdal 3 mg twice a day.  He was last seen in November 2019.  He admitted lately more anxious because of pandemic coronavirus.  He lives by himself but he has siblings in different times.  He is scared to travel.  Recently he was given meloxicam for his arthritis and knee joint pain.  His knees who is doing medicine in [name] now moved back and sometime he enjoys the company with her on the phone.  Patient has upcoming blood work and physical in few months but he is not sure if it will happen due to pandemic coronavirus.  He is sleeping good.  He denies any irritability, anger, mania or psychosis.  He denies any tremors or shakes and does not want to change his medication.  Denies drinking or using any illegal substances.  Past Psychiatric History: Reviewed H/O schizophrenia since 36. H/O psychotic episode while working in [place]. No h/o inpatient or suicidal attempt. H/O taking Stelazine.    Observations/Objective: Mental status examination done on the phone.  He described his mood anxious.  His speech is slow but soft and coherent.  His thought process logical and goal-directed.  He denies any auditory or visual hallucination.  He denies any active or passive suicidal thoughts or homicidal thought.  There were no grandiosity or delusions.  He reported no tremors or shakes.  He is  alert and oriented x3.  His attention and concentration is fair.  There were no flight of ideas or loose association.  His cognition is good.  His immediate and recent memory is good.  His fund of knowledge is adequate.  His insight judgment is okay.  Assessment and Plan: Schizophrenia chronic paranoid type.  Anxiety.  Reassurance given due to current pandemic situation.  He is trying not to go outside unless it is important.  I offered counseling but he does not feel that he needed at this time.  Like to continue his current medication.  He reported no tremors or shakes.  I reviewed his medication he is taking meloxicam for his joint pain.  I will continue Risperdal 3 mg twice a day.  I recommended to call us back if is any question or any concern.  Follow-up in 6 months.  Follow Up Instructions:    I discussed the assessment and treatment plan with the patient. The patient was provided an opportunity to ask questions and all were answered. The patient agreed with the plan and demonstrated an understanding of the instructions.   The patient was advised to call back or seek an in-person evaluation if the symptoms worsen or if the condition fails to improve as anticipated.  I provided 15 minutes of non-face-to-face time during this encounter.   Cleotis Nipper, MD

## 2018-06-01 ENCOUNTER — Ambulatory Visit (HOSPITAL_COMMUNITY): Payer: Medicare Other | Admitting: Psychiatry

## 2018-06-05 DIAGNOSIS — M25552 Pain in left hip: Secondary | ICD-10-CM | POA: Diagnosis not present

## 2018-10-25 DIAGNOSIS — R7301 Impaired fasting glucose: Secondary | ICD-10-CM | POA: Diagnosis not present

## 2018-10-25 DIAGNOSIS — I10 Essential (primary) hypertension: Secondary | ICD-10-CM | POA: Diagnosis not present

## 2018-10-25 DIAGNOSIS — E7800 Pure hypercholesterolemia, unspecified: Secondary | ICD-10-CM | POA: Diagnosis not present

## 2018-10-31 DIAGNOSIS — Z23 Encounter for immunization: Secondary | ICD-10-CM | POA: Diagnosis not present

## 2018-10-31 DIAGNOSIS — E7800 Pure hypercholesterolemia, unspecified: Secondary | ICD-10-CM | POA: Diagnosis not present

## 2018-10-31 DIAGNOSIS — M160 Bilateral primary osteoarthritis of hip: Secondary | ICD-10-CM | POA: Diagnosis not present

## 2018-10-31 DIAGNOSIS — I10 Essential (primary) hypertension: Secondary | ICD-10-CM | POA: Diagnosis not present

## 2018-11-07 DIAGNOSIS — M1612 Unilateral primary osteoarthritis, left hip: Secondary | ICD-10-CM | POA: Diagnosis not present

## 2018-11-30 ENCOUNTER — Ambulatory Visit (INDEPENDENT_AMBULATORY_CARE_PROVIDER_SITE_OTHER): Payer: Medicare Other | Admitting: Psychiatry

## 2018-11-30 ENCOUNTER — Encounter (HOSPITAL_COMMUNITY): Payer: Self-pay | Admitting: Psychiatry

## 2018-11-30 ENCOUNTER — Other Ambulatory Visit: Payer: Self-pay

## 2018-11-30 DIAGNOSIS — F200 Paranoid schizophrenia: Secondary | ICD-10-CM

## 2018-11-30 MED ORDER — RISPERIDONE 2 MG PO TABS: 2.0000 mg | ORAL_TABLET | Freq: Two times a day (BID) | ORAL | 1 refills | Status: DC

## 2018-11-30 NOTE — Progress Notes (Signed)
Virtual Visit via Telephone Note  I connected with Patrick Moreno on 11/30/18 at  8:40 AM EDT by telephone and verified that I am speaking with the correct person using two identifiers.   I discussed the limitations, risks, security and privacy concerns of performing an evaluation and management service by telephone and the availability of in person appointments. I also discussed with the patient that there may be a patient responsible charge related to this service. The patient expressed understanding and agreed to proceed.   History of Present Illness: Patient was evaluated by phone session.  He is taking Risperdal 3 mg twice a day.  He feels pretty stable on this medication.  He denies any paranoia, hallucination, irritability, anxiety or any anger.  He sleeps good.  Now he gets injection in his joint which helps his arthritis and he started walking little bit.  He admitted weight loss since the last visit because he watching his calorie intake.  He has cut down the snacks and soda.  Recently he has a blood work by Dr. Bynum Bellows and he was told everything is normal.  He admitted overall anxiety due to pandemic and he does not leave the house unnecessary.  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  Patient has no tremors, shakes or any EPS.  Denies drinking or using any illegal substances.  We talked about reducing Risperdal since he is very stable on this medication for a while and it is a higher dose.  Patient is willing to try cutting down the dose and he agreed that if symptoms started to appear then he will call us to go back to his previous dose.  Past Psychiatric History:Reviewed H/O schizophrenia since 63. H/O psychotic episode while working in [place]. No h/o inpatient or suicidal attempt. H/O taking Stelazine.     Psychiatric Specialty Exam: Physical Exam  ROS  There were no vitals taken for this  visit.There is no height or weight on file to calculate BMI.  General Appearance: NA  Eye Contact:  NA  Speech:  Clear and Coherent and Normal Rate  Volume:  Normal  Mood:  Euthymic  Affect:  NA  Thought Process:  Goal Directed  Orientation:  Full (Time, Place, and Person)  Thought Content:  WDL and Logical  Suicidal Thoughts:  No  Homicidal Thoughts:  No  Memory:  Immediate;   Good Recent;   Good Remote;   Good  Judgement:  Good  Insight:  Good  Psychomotor Activity:  NA  Concentration:  Concentration: Fair and Attention Span: Fair  Recall:  Good  Fund of Knowledge:  Good  Language:  Good  Akathisia:  No  Handed:  Right  AIMS (if indicated):     Assets:  Communication Skills Desire for Improvement Housing Resilience  ADL's:  Intact  Cognition:  WNL  Sleep:   ok      Assessment and Plan: Schizophrenia chronic paranoid type.  Discuss his current dose.  We discussed reducing the dose from 3 mg Resporal wice a day Risperdal 2 mg twice a day.  He also understand that if symptoms started as to reappear then he will go back to his original dose.  So far patient has no tremors, shakes or any EPS.  Discussed medication side effects and benefits.  Recommended to call us back if he has any question or any concern.  Follow-up in 6 months.  Follow Up Instructions:  I discussed the assessment and treatment plan with the patient. The patient was provided an opportunity to ask questions and all were answered. The patient agreed with the plan and demonstrated an understanding of the instructions.   The patient was advised to call back or seek an in-person evaluation if the symptoms worsen or if the condition fails to improve as anticipated.  I provided 20 minutes of non-face-to-face time during this encounter.   Kathlee Nations, MD

## 2019-02-22 DIAGNOSIS — M1612 Unilateral primary osteoarthritis, left hip: Secondary | ICD-10-CM | POA: Diagnosis not present

## 2019-05-16 ENCOUNTER — Telehealth (HOSPITAL_COMMUNITY): Payer: Self-pay

## 2019-05-16 DIAGNOSIS — F200 Paranoid schizophrenia: Secondary | ICD-10-CM

## 2019-05-16 MED ORDER — RISPERIDONE 2 MG PO TABS: 2.0000 mg | ORAL_TABLET | Freq: Two times a day (BID) | ORAL | 0 refills | Status: DC

## 2019-05-16 NOTE — Telephone Encounter (Signed)
Pt called requesting medication refill for risperidone 2mg  tabs. States he has appointment 4/30 but will be out of medication prior to that date. Denies side effects. States he is "doing good". [facility] on [address], [place].

## 2019-05-16 NOTE — Telephone Encounter (Signed)
Done

## 2019-05-22 DIAGNOSIS — M1612 Unilateral primary osteoarthritis, left hip: Secondary | ICD-10-CM | POA: Diagnosis not present

## 2019-05-31 ENCOUNTER — Encounter (HOSPITAL_COMMUNITY): Payer: Self-pay | Admitting: Psychiatry

## 2019-05-31 ENCOUNTER — Telehealth (INDEPENDENT_AMBULATORY_CARE_PROVIDER_SITE_OTHER): Payer: Medicare Other | Admitting: Psychiatry

## 2019-05-31 ENCOUNTER — Other Ambulatory Visit: Payer: Self-pay

## 2019-05-31 DIAGNOSIS — F200 Paranoid schizophrenia: Secondary | ICD-10-CM | POA: Diagnosis not present

## 2019-05-31 MED ORDER — RISPERIDONE 2 MG PO TABS: 2.0000 mg | ORAL_TABLET | Freq: Two times a day (BID) | ORAL | 0 refills | Status: DC

## 2019-05-31 NOTE — Progress Notes (Signed)
Virtual Visit via Telephone Note  I connected with Patrick Moreno on 05/31/19 at 10:00 AM EDT by telephone and verified that I am speaking with the correct person using two identifiers.   I discussed the limitations, risks, security and privacy concerns of performing an evaluation and management service by telephone and the availability of in person appointments. I also discussed with the patient that there may be a patient responsible charge related to this service. The patient expressed understanding and agreed to proceed.   History of Present Illness: Patient is evaluated by phone session.  He does not have computer and does not activate his my chart.  He has been doing good on medicine.  On the last visit we cut down his Risperdal to 2 mg and he still take twice a day.  He do not see any worsening of symptoms.  He is sleeping good.  He has no paranoia, delusions, hallucinations or anxiety.  He has no tremors or shakes.  He is getting injection in his hip to help his arthritis pain.  He is trying to lose weight but cannot walk as much because of pain.  However his weight is a stable.  He is happy that he got COVID vaccine.  His nephew lives in [place], niece in [place] and one brother lives in [place].  He lives by himself but he had a good support from his neighbors.  He has a physical and blood work coming up in September.  For now we like to keep his Risperdal 2 mg twice a day however interested to further cut it down on his next appointment.     Past Psychiatric History:Reviewed H/Oschizophrenia since 1991. [place]. No h/o inpatient orsuicidalattempt. H/OtakingStelazine.    Psychiatric Specialty Exam: Physical Exam  Review of Systems  Weight 183 lb (83 kg).There is no height or weight on file to calculate BMI.  General Appearance: NA  Eye Contact:  NA  Speech:  Clear and Coherent and Slow  Volume:  Normal  Mood:  Euthymic   Affect:  NA  Thought Process:  Descriptions of Associations: Intact  Orientation:  Full (Time, Place, and Person)  Thought Content:  WDL  Suicidal Thoughts:  No  Homicidal Thoughts:  No  Memory:  Immediate;   Good Recent;   Good Remote;   Good  Judgement:  Intact  Insight:  Present  Psychomotor Activity:  NA  Concentration:  Concentration: Fair and Attention Span: Fair  Recall:  Good  Fund of Knowledge:  Good  Language:  Good  Akathisia:  No  Handed:  Right  AIMS (if indicated):     Assets:  Communication Skills Desire for Improvement Housing Resilience Transportation  ADL's:  Intact  Cognition:  WNL  Sleep:   ok      Assessment and Plan: Schizophrenia chronic paranoid type.  Patient doing well since we cut down the dose from 3 mg twice a day to 2 mg twice a day.  He is a little reluctant to cut down further but interested to think about his next appointment.  He has an upcoming physical and blood work in September.  We will continue Risperdal 2 mg twice a day.  I recommend to call us back if is any question or any concerns.  Follow-up in 6 months.  We will consider further cutting down the dose on his next appointment.  Follow Up Instructions:    I discussed the assessment and treatment plan with the patient.  The patient was provided an opportunity to ask questions and all were answered. The patient agreed with the plan and demonstrated an understanding of the instructions.   The patient was advised to call back or seek an in-person evaluation if the symptoms worsen or if the condition fails to improve as anticipated.  I provided 15 minutes of non-face-to-face time during this encounter.   Cleotis Nipper, MD

## 2019-08-21 ENCOUNTER — Other Ambulatory Visit (HOSPITAL_COMMUNITY): Payer: Self-pay | Admitting: *Deleted

## 2019-08-21 DIAGNOSIS — R001 Bradycardia, unspecified: Secondary | ICD-10-CM | POA: Diagnosis not present

## 2019-08-21 DIAGNOSIS — M1612 Unilateral primary osteoarthritis, left hip: Secondary | ICD-10-CM | POA: Diagnosis not present

## 2019-08-21 DIAGNOSIS — Z01818 Encounter for other preprocedural examination: Secondary | ICD-10-CM | POA: Diagnosis not present

## 2019-08-21 DIAGNOSIS — F200 Paranoid schizophrenia: Secondary | ICD-10-CM

## 2019-08-21 DIAGNOSIS — I10 Essential (primary) hypertension: Secondary | ICD-10-CM | POA: Diagnosis not present

## 2019-08-21 DIAGNOSIS — I440 Atrioventricular block, first degree: Secondary | ICD-10-CM | POA: Diagnosis not present

## 2019-08-21 MED ORDER — RISPERIDONE 2 MG PO TABS: 2.0000 mg | ORAL_TABLET | Freq: Two times a day (BID) | ORAL | 0 refills | Status: DC

## 2019-09-09 ENCOUNTER — Ambulatory Visit: Payer: Self-pay | Admitting: Student

## 2019-10-04 ENCOUNTER — Ambulatory Visit: Payer: Self-pay | Admitting: Student

## 2019-10-22 ENCOUNTER — Ambulatory Visit: Payer: Self-pay | Admitting: Student

## 2019-10-22 NOTE — H&P (View-Only) (Signed)
TOTAL HIP ADMISSION H&P  Patient is admitted for left total hip arthroplasty.  Subjective:  Chief Complaint: left hip pain  HPI: Patrick Moreno, 64 y.o. male, has a history of pain and functional disability in the left hip(s) due to arthritis and patient has failed non-surgical conservative treatments for greater than 12 weeks to include NSAID's and/or analgesics and activity modification.  Onset of symptoms was gradual starting 3 years ago with gradually worsening course since that time.The patient noted no past surgery on the left hip(s).  Patient currently rates pain in the left hip at 9 out of 10 with activity. Patient has worsening of pain with activity and weight bearing, pain that interfers with activities of daily living and pain with passive range of motion. Patient has evidence of subchondral cysts, subchondral sclerosis and joint space narrowing by imaging studies. This condition presents safety issues increasing the risk of falls. There is no current active infection.  Patient Active Problem List   Diagnosis Date Noted  . Pain of left hip joint 05/24/2018  . Elevated fasting glucose 05/10/2017  . Arthritis of left hip 05/10/2017  . Overweight (BMI 25.0-29.9) 08/11/2015  . HTN (hypertension) 05/07/2012  . Hyperlipidemia 05/07/2012   Past Medical History:  Diagnosis Date  . Arthritis 08/24/2016   In Left Hip  . HTN (hypertension)   . HTN (hypertension)   . Hyperlipemia   . Schizophrenia (HCC)     No past surgical history on file.  Current Outpatient Medications  Medication Sig Dispense Refill Last Dose  . hydrochlorothiazide (HYDRODIURIL) 12.5 MG tablet Take 12.5 mg by mouth daily.     . meloxicam (MOBIC) 15 MG tablet Take 15 mg by mouth daily as needed. (Patient not taking: Reported on 10/14/2019)     . metoprolol (LOPRESSOR) 100 MG tablet Take 100 mg by mouth 2 (two) times daily.       . Multiple Vitamin (MULTIVITAMIN WITH MINERALS) TABS tablet Take 1 tablet by mouth  daily.     . naproxen sodium (ALEVE) 220 MG tablet Take 220 mg by mouth 2 (two) times daily with a meal.     . quinapril (ACCUPRIL) 40 MG tablet Take 40 mg by mouth at bedtime.       . risperiDONE (RISPERDAL) 2 MG tablet Take 1 tablet (2 mg total) by mouth 2 (two) times daily. 180 tablet 0    No current facility-administered medications for this visit.   No Known Allergies  Social History   Tobacco Use  . Smoking status: Never Smoker  . Smokeless tobacco: Never Used  Substance Use Topics  . Alcohol use: Yes    Alcohol/week: 4.0 standard drinks    Types: 4 Cans of beer per week    No family history on file.   Review of Systems  Constitutional: Negative.   HENT: Negative.   Eyes: Negative.   Respiratory: Negative.   Cardiovascular: Negative.   Gastrointestinal: Negative.   Endocrine: Negative.   Genitourinary: Negative.   Musculoskeletal: Positive for arthralgias.  Skin: Negative.   Allergic/Immunologic: Negative.   Neurological: Negative.   Hematological: Negative.   Psychiatric/Behavioral: Negative.     Objective:  Physical Exam Constitutional:      Appearance: Normal appearance. He is normal weight.  HENT:     Head: Normocephalic.     Nose: Nose normal.  Eyes:     Pupils: Pupils are equal, round, and reactive to light.  Cardiovascular:     Rate and Rhythm: Normal rate and regular  rhythm.     Pulses: Normal pulses.  Pulmonary:     Effort: Pulmonary effort is normal.     Breath sounds: Normal breath sounds.  Abdominal:     Palpations: Abdomen is soft.     Tenderness: There is no abdominal tenderness.  Genitourinary:    Comments: deferred Musculoskeletal:     Cervical back: Normal range of motion.     Comments: The left hip is very stiff.  He has a 25 degree flexion contracture. He flexes up to 90 degrees, internally rotates 0, externally rotates 15 degrees. He has pain with flexion and rotation.   Skin:    General: Skin is warm and dry.  Neurological:      Mental Status: He is alert and oriented to person, place, and time.  Psychiatric:        Mood and Affect: Mood normal.     Vital signs in last 24 hours: @VSRANGES @  Labs:   Estimated body mass index is 29.99 kg/m as calculated from the following:   Height as of 12/01/17: 5' 5.5" (1.664 m).   Weight as of 05/31/19: 83 kg.   Imaging Review Plain radiographs demonstrate severe degenerative joint disease of the left hip(s). The bone quality appears to be adequate for age and reported activity level.      Assessment/Plan:  End stage arthritis, left hip(s)  The patient history, physical examination, clinical judgement of the provider and imaging studies are consistent with end stage degenerative joint disease of the left hip(s) and total hip arthroplasty is deemed medically necessary. The treatment options including medical management, injection therapy, arthroscopy and arthroplasty were discussed at length. The risks and benefits of total hip arthroplasty were presented and reviewed. The risks due to aseptic loosening, infection, stiffness, dislocation/subluxation,  thromboembolic complications and other imponderables were discussed.  The patient acknowledged the explanation, agreed to proceed with the plan and consent was signed. Patient is being admitted for inpatient treatment for surgery, pain control, PT, OT, prophylactic antibiotics, VTE prophylaxis, progressive ambulation and ADL's and discharge planning.The patient is planning to be discharged home where he will complete a home exercise plan.

## 2019-10-22 NOTE — H&P (Signed)
TOTAL HIP ADMISSION H&P  Patient is admitted for left total hip arthroplasty.  Subjective:  Chief Complaint: left hip pain  HPI: Patrick Moreno, 64 y.o. male, has a history of pain and functional disability in the left hip(s) due to arthritis and patient has failed non-surgical conservative treatments for greater than 12 weeks to include NSAID's and/or analgesics and activity modification.  Onset of symptoms was gradual starting 3 years ago with gradually worsening course since that time.The patient noted no past surgery on the left hip(s).  Patient currently rates pain in the left hip at 9 out of 10 with activity. Patient has worsening of pain with activity and weight bearing, pain that interfers with activities of daily living and pain with passive range of motion. Patient has evidence of subchondral cysts, subchondral sclerosis and joint space narrowing by imaging studies. This condition presents safety issues increasing the risk of falls. There is no current active infection.  Patient Active Problem List   Diagnosis Date Noted  . Pain of left hip joint 05/24/2018  . Elevated fasting glucose 05/10/2017  . Arthritis of left hip 05/10/2017  . Overweight (BMI 25.0-29.9) 08/11/2015  . HTN (hypertension) 05/07/2012  . Hyperlipidemia 05/07/2012   Past Medical History:  Diagnosis Date  . Arthritis 08/24/2016   In Left Hip  . HTN (hypertension)   . HTN (hypertension)   . Hyperlipemia   . Schizophrenia (HCC)     No past surgical history on file.  Current Outpatient Medications  Medication Sig Dispense Refill Last Dose  . hydrochlorothiazide (HYDRODIURIL) 12.5 MG tablet Take 12.5 mg by mouth daily.     . meloxicam (MOBIC) 15 MG tablet Take 15 mg by mouth daily as needed. (Patient not taking: Reported on 10/14/2019)     . metoprolol (LOPRESSOR) 100 MG tablet Take 100 mg by mouth 2 (two) times daily.       . Multiple Vitamin (MULTIVITAMIN WITH MINERALS) TABS tablet Take 1 tablet by mouth  daily.     . naproxen sodium (ALEVE) 220 MG tablet Take 220 mg by mouth 2 (two) times daily with a meal.     . quinapril (ACCUPRIL) 40 MG tablet Take 40 mg by mouth at bedtime.       . risperiDONE (RISPERDAL) 2 MG tablet Take 1 tablet (2 mg total) by mouth 2 (two) times daily. 180 tablet 0    No current facility-administered medications for this visit.   No Known Allergies  Social History   Tobacco Use  . Smoking status: Never Smoker  . Smokeless tobacco: Never Used  Substance Use Topics  . Alcohol use: Yes    Alcohol/week: 4.0 standard drinks    Types: 4 Cans of beer per week    No family history on file.   Review of Systems  Constitutional: Negative.   HENT: Negative.   Eyes: Negative.   Respiratory: Negative.   Cardiovascular: Negative.   Gastrointestinal: Negative.   Endocrine: Negative.   Genitourinary: Negative.   Musculoskeletal: Positive for arthralgias.  Skin: Negative.   Allergic/Immunologic: Negative.   Neurological: Negative.   Hematological: Negative.   Psychiatric/Behavioral: Negative.     Objective:  Physical Exam Constitutional:      Appearance: Normal appearance. He is normal weight.  HENT:     Head: Normocephalic.     Nose: Nose normal.  Eyes:     Pupils: Pupils are equal, round, and reactive to light.  Cardiovascular:     Rate and Rhythm: Normal rate and regular   rhythm.     Pulses: Normal pulses.  Pulmonary:     Effort: Pulmonary effort is normal.     Breath sounds: Normal breath sounds.  Abdominal:     Palpations: Abdomen is soft.     Tenderness: There is no abdominal tenderness.  Genitourinary:    Comments: deferred Musculoskeletal:     Cervical back: Normal range of motion.     Comments: The left hip is very stiff.  He has a 25 degree flexion contracture. He flexes up to 90 degrees, internally rotates 0, externally rotates 15 degrees. He has pain with flexion and rotation.   Skin:    General: Skin is warm and dry.  Neurological:      Mental Status: He is alert and oriented to person, place, and time.  Psychiatric:        Mood and Affect: Mood normal.     Vital signs in last 24 hours: @VSRANGES @  Labs:   Estimated body mass index is 29.99 kg/m as calculated from the following:   Height as of 12/01/17: 5' 5.5" (1.664 m).   Weight as of 05/31/19: 83 kg.   Imaging Review Plain radiographs demonstrate severe degenerative joint disease of the left hip(s). The bone quality appears to be adequate for age and reported activity level.      Assessment/Plan:  End stage arthritis, left hip(s)  The patient history, physical examination, clinical judgement of the provider and imaging studies are consistent with end stage degenerative joint disease of the left hip(s) and total hip arthroplasty is deemed medically necessary. The treatment options including medical management, injection therapy, arthroscopy and arthroplasty were discussed at length. The risks and benefits of total hip arthroplasty were presented and reviewed. The risks due to aseptic loosening, infection, stiffness, dislocation/subluxation,  thromboembolic complications and other imponderables were discussed.  The patient acknowledged the explanation, agreed to proceed with the plan and consent was signed. Patient is being admitted for inpatient treatment for surgery, pain control, PT, OT, prophylactic antibiotics, VTE prophylaxis, progressive ambulation and ADL's and discharge planning.The patient is planning to be discharged home where he will complete a home exercise plan.

## 2019-10-22 NOTE — Patient Instructions (Addendum)
DUE TO COVID-19 ONLY ONE VISITOR IS ALLOWED TO COME WITH YOU AND STAY IN THE WAITING ROOM ONLY DURING PRE OP AND PROCEDURE DAY OF SURGERY. THE 1 VISITOR  MAY VISIT WITH YOU AFTER SURGERY IN YOUR PRIVATE ROOM DURING VISITING HOURS ONLY!  YOU NEED TO HAVE A COVID 19 TEST ON__9/25_____ @11 :30_______, THIS TEST MUST BE DONE BEFORE SURGERY,  COVID TESTING SITE [address] [place] , IT IS ON THE RIGHT GOING OUT [address] APPROXIMATELY  2 MINUTES PAST [employer] ON THE RIGHT. ONCE YOUR COVID TEST IS COMPLETED,  PLEASE BEGIN THE QUARANTINE INSTRUCTIONS AS OUTLINED IN YOUR HANDOUT.                Patrick Moreno    Your procedure is scheduled on: 10/30/19   Report to [facility] Main  Entrance   Report to Short Stay at 5:30  AM     Call this number if you have problems the morning of surgery [phone]   BRUSH YOUR TEETH MORNING OF SURGERY AND RINSE YOUR MOUTH OUT, NO CHEWING GUM CANDY OR MINTS.   No food after midnight.    You may have clear liquid until 4:30 AM.    At 4:30 AM drink pre surgery drink.   Nothing by mouth after 4:30 AM.    Take these medicines the morning of surgery with A SIP OF WATER: Risperidone, Metoprolol                                 You may not have any metal on your body including               piercings  Do not wear jewelry, lotions, powders or deodorant                    Men may shave face and neck.   Do not bring valuables to the hospital. [facility] IS NOT             RESPONSIBLE   FOR VALUABLES.  Contacts, dentures or bridgework may not be worn into surgery.       Patients discharged the day of surgery will not be allowed to drive home.   IF YOU ARE HAVING SURGERY AND GOING HOME THE SAME DAY, YOU MUST HAVE AN ADULT TO DRIVE YOU HOME AND BE WITH YOU FOR 24 HOURS.   YOU MAY GO HOME BY TAXI OR UBER OR ORTHERWISE, BUT AN ADULT MUST ACCOMPANY YOU HOME AND STAY WITH YOU FOR 24 HOURS.  Name and phone  number of your driver:  Special Instructions: N/A              Please read over the following fact sheets you were given: _____________________________________________________________________                [facility] - Preparing for Surgery Before surgery, you can play an important role.   Because skin is not sterile, your skin needs to be as free of germs as possible.  You can reduce the number of germs on your skin by washing with CHG (chlorahexidine gluconate) soap before surgery.   CHG is an antiseptic cleaner which kills germs and bonds with the skin to continue killing germs even after washing. Please DO NOT use if you have an allergy to CHG or antibacterial soaps.   If your skin becomes reddened/irritated stop using the  CHG and inform your nurse when you arrive at Short Stay.   You may shave your face/neck.  Please follow these instructions carefully:  1.  Shower with CHG Soap the night before surgery and the  morning of Surgery.  2.  If you choose to wash your hair, wash your hair first as usual with your  normal  shampoo.  3.  After you shampoo, rinse your hair and body thoroughly to remove the  shampoo.                                        4.  Use CHG as you would any other liquid soap.  You can apply chg directly  to the skin and wash                       Gently with a scrungie or clean washcloth.  5.  Apply the CHG Soap to your body ONLY FROM THE NECK DOWN.   Do not use on face/ open                           Wound or open sores. Avoid contact with eyes, ears mouth and genitals (private parts).                       Wash face,  Genitals (private parts) with your normal soap.             6.  Wash thoroughly, paying special attention to the area where your surgery  will be performed.  7.  Thoroughly rinse your body with warm water from the neck down.  8.  DO NOT shower/wash with your normal soap after using and rinsing off  the CHG Soap.             9.  Pat yourself  dry with a clean towel.            10.  Wear clean pajamas.            11.  Place clean sheets on your bed the night of your first shower and do not  sleep with pets. Day of Surgery : Do not apply any lotions/deodorants the morning of surgery.  Please wear clean clothes to the hospital/surgery center.  FAILURE TO FOLLOW THESE INSTRUCTIONS MAY RESULT IN THE CANCELLATION OF YOUR SURGERY PATIENT SIGNATURE_________________________________  NURSE SIGNATURE__________________________________  ________________________________________________________________________   Patrick Moreno  An incentive spirometer is a tool that can help keep your lungs clear and active. This tool measures how well you are filling your lungs with each breath. Taking long deep breaths may help reverse or decrease the chance of developing breathing (pulmonary) problems (especially infection) following:  A long period of time when you are unable to move or be active. BEFORE THE PROCEDURE   If the spirometer includes an indicator to show your best effort, your nurse or respiratory therapist will set it to a desired goal.  If possible, sit up straight or lean slightly forward. Try not to slouch.  Hold the incentive spirometer in an upright position. INSTRUCTIONS FOR USE  1. Sit on the edge of your bed if possible, or sit up as far as you can in bed or on a chair. 2. Hold the incentive spirometer in an upright position. 3. Breathe out normally.  4. Place the mouthpiece in your mouth and seal your lips tightly around it. 5. Breathe in slowly and as deeply as possible, raising the piston or the ball toward the top of the column. 6. Hold your breath for 3-5 seconds or for as long as possible. Allow the piston or ball to fall to the bottom of the column. 7. Remove the mouthpiece from your mouth and breathe out normally. 8. Rest for a few seconds and repeat Steps 1 through 7 at least 10 times every 1-2 hours when you are  awake. Take your time and take a few normal breaths between deep breaths. 9. The spirometer may include an indicator to show your best effort. Use the indicator as a goal to work toward during each repetition. 10. After each set of 10 deep breaths, practice coughing to be sure your lungs are clear. If you have an incision (the cut made at the time of surgery), support your incision when coughing by placing a pillow or rolled up towels firmly against it. Once you are able to get out of bed, walk around indoors and cough well. You may stop using the incentive spirometer when instructed by your caregiver.  RISKS AND COMPLICATIONS  Take your time so you do not get dizzy or light-headed.  If you are in pain, you may need to take or ask for pain medication before doing incentive spirometry. It is harder to take a deep breath if you are having pain. AFTER USE  Rest and breathe slowly and easily.  It can be helpful to keep track of a log of your progress. Your caregiver can provide you with a simple table to help with this. If you are using the spirometer at home, follow these instructions: SEEK MEDICAL CARE IF:   You are having difficultly using the spirometer.  You have trouble using the spirometer as often as instructed.  Your pain medication is not giving enough relief while using the spirometer.  You develop fever of 100.5 F (38.1 C) or higher. SEEK IMMEDIATE MEDICAL CARE IF:   You cough up bloody sputum that had not been present before.  You develop fever of 102 F (38.9 C) or greater.  You develop worsening pain at or near the incision site. MAKE SURE YOU:   Understand these instructions.  Will watch your condition.  Will get help right away if you are not doing well or get worse. Document Released: 05/30/2006 Document Revised: 04/11/2011 Document Reviewed: 07/31/2006 [facility] Patient Information 2014 [place],  [place].   ________________________________________________________________________

## 2019-10-23 ENCOUNTER — Other Ambulatory Visit: Payer: Self-pay

## 2019-10-23 ENCOUNTER — Encounter (HOSPITAL_COMMUNITY): Payer: Self-pay

## 2019-10-23 ENCOUNTER — Encounter (HOSPITAL_COMMUNITY)
Admission: RE | Admit: 2019-10-23 | Discharge: 2019-10-23 | Disposition: A | Discharge status: Home / Self Care | Payer: Medicare Other | Source: Ambulatory Visit | Attending: Orthopedic Surgery | Admitting: Orthopedic Surgery

## 2019-10-23 ENCOUNTER — Other Ambulatory Visit (HOSPITAL_COMMUNITY): Payer: Self-pay

## 2019-10-23 DIAGNOSIS — Z01812 Encounter for preprocedural laboratory examination: Secondary | ICD-10-CM | POA: Diagnosis not present

## 2019-10-23 LAB — SURGICAL PCR SCREEN
MRSA, PCR: NEGATIVE
Staphylococcus aureus: NEGATIVE

## 2019-10-23 LAB — COMPREHENSIVE METABOLIC PANEL
ALT: 20 U/L (ref 0–44)
AST: 18 U/L (ref 15–41)
Albumin: 4.3 g/dL (ref 3.5–5.0)
Alkaline Phosphatase: 69 U/L (ref 38–126)
Anion gap: 13 (ref 5–15)
BUN: 19 mg/dL (ref 8–23)
CO2: 25 mmol/L (ref 22–32)
Calcium: 9.4 mg/dL (ref 8.9–10.3)
Chloride: 98 mmol/L (ref 98–111)
Creatinine, Ser: 0.82 mg/dL (ref 0.61–1.24)
GFR calc Af Amer: >60 mL/min (ref >60)
GFR calc non Af Amer: >60 mL/min (ref >60)
Glucose, Bld: 119 mg/dL — ABNORMAL HIGH (ref 70–99)
Potassium: 3.8 mmol/L (ref 3.5–5.1)
Sodium: 136 mmol/L (ref 135–145)
Total Bilirubin: 0.9 mg/dL (ref 0.3–1.2)
Total Protein: 6.6 g/dL (ref 6.5–8.1)

## 2019-10-23 LAB — CBC
HCT: 37.6 % — ABNORMAL LOW (ref 39.0–52.0)
Hemoglobin: 12.9 g/dL — ABNORMAL LOW (ref 13.0–17.0)
MCH: 32 pg (ref 26.0–34.0)
MCHC: 34.3 g/dL (ref 30.0–36.0)
MCV: 93.3 fL (ref 80.0–100.0)
Platelets: 248 10*3/uL (ref 150–400)
RBC: 4.03 MIL/uL — ABNORMAL LOW (ref 4.22–5.81)
RDW: 12.4 % (ref 11.5–15.5)
WBC: 7.7 10*3/uL (ref 4.0–10.5)
nRBC: 0 % (ref 0.0–0.2)

## 2019-10-23 LAB — URINALYSIS, ROUTINE W REFLEX MICROSCOPIC
Bilirubin Urine: NEGATIVE
Glucose, UA: NEGATIVE mg/dL
Ketones, ur: 5 mg/dL — AB
Nitrite: NEGATIVE
Protein, ur: NEGATIVE mg/dL
Specific Gravity, Urine: 1.015 (ref 1.005–1.030)
pH: 6 (ref 5.0–8.0)

## 2019-10-23 LAB — PROTIME-INR
INR: 1 (ref 0.8–1.2)
Prothrombin Time: 12.9 seconds (ref 11.4–15.2)

## 2019-10-23 NOTE — Progress Notes (Signed)
COVID Vaccine Completed:Yes Date COVID Vaccine completed:04/16/19 COVID vaccine manufacturer: Pfizer     PCP - Dr. Andrey Campan[facility] med Cardiologist - no  Chest x-ray - no EKG - 08/21/19 - Care everywhere. Requested 10/23/19 Stress Test - no ECHO - no Cardiac Cath - no Pacemaker/ICD device last checked:NA  Sleep Study - no CPAP -   Fasting Blood Sugar - NA Checks Blood Sugar _____ times a day  Blood Thinner Instructions:NA Aspirin Instructions: Last Dose:  Anesthesia review:   Patient denies shortness of breath, fever, cough and chest pain at PAT appointment Yes  Patient verbalized understanding of instructions that were given to them at the PAT appointment. Patient was also instructed that they will need to review over the PAT instructions again at home before surgery.  Yes  Pt used to walk a lot before the hip pain got too bad. He has no SOB climbing stairs, working or with ADLs.   Pt has schizophrenia that is well controlled. He has never had general anesthesia.

## 2019-10-26 ENCOUNTER — Other Ambulatory Visit (HOSPITAL_COMMUNITY)
Admission: RE | Admit: 2019-10-26 | Discharge: 2019-10-26 | Disposition: A | Discharge status: Home / Self Care | Payer: Medicare Other | Source: Ambulatory Visit | Attending: Orthopedic Surgery | Admitting: Orthopedic Surgery

## 2019-10-26 DIAGNOSIS — Z01812 Encounter for preprocedural laboratory examination: Secondary | ICD-10-CM | POA: Diagnosis not present

## 2019-10-26 DIAGNOSIS — Z20822 Contact with and (suspected) exposure to covid-19: Secondary | ICD-10-CM | POA: Insufficient documentation

## 2019-10-26 LAB — SARS CORONAVIRUS 2 (TAT 6-24 HRS): SARS Coronavirus 2: NEGATIVE

## 2019-10-30 ENCOUNTER — Encounter (HOSPITAL_COMMUNITY): Payer: Self-pay | Admitting: Orthopedic Surgery

## 2019-10-30 ENCOUNTER — Ambulatory Visit (HOSPITAL_COMMUNITY): Payer: Medicare Other | Admitting: Certified Registered Nurse Anesthetist

## 2019-10-30 ENCOUNTER — Ambulatory Visit (HOSPITAL_COMMUNITY): Payer: Medicare Other

## 2019-10-30 ENCOUNTER — Ambulatory Visit (HOSPITAL_COMMUNITY)
Admission: RE | Admit: 2019-10-30 | Discharge: 2019-10-30 | Disposition: A | Discharge status: Home / Self Care | Payer: Medicare Other | Source: Ambulatory Visit | Attending: Orthopedic Surgery | Admitting: Orthopedic Surgery

## 2019-10-30 ENCOUNTER — Encounter (HOSPITAL_COMMUNITY)
Admission: RE | Disposition: A | Discharge status: Home / Self Care | Payer: Self-pay | Source: Ambulatory Visit | Attending: Orthopedic Surgery

## 2019-10-30 DIAGNOSIS — F209 Schizophrenia, unspecified: Secondary | ICD-10-CM | POA: Diagnosis not present

## 2019-10-30 DIAGNOSIS — Z96642 Presence of left artificial hip joint: Secondary | ICD-10-CM | POA: Diagnosis not present

## 2019-10-30 DIAGNOSIS — Z419 Encounter for procedure for purposes other than remedying health state, unspecified: Secondary | ICD-10-CM

## 2019-10-30 DIAGNOSIS — M25852 Other specified joint disorders, left hip: Secondary | ICD-10-CM | POA: Insufficient documentation

## 2019-10-30 DIAGNOSIS — Z471 Aftercare following joint replacement surgery: Secondary | ICD-10-CM | POA: Diagnosis not present

## 2019-10-30 DIAGNOSIS — I10 Essential (primary) hypertension: Secondary | ICD-10-CM | POA: Diagnosis not present

## 2019-10-30 DIAGNOSIS — M1612 Unilateral primary osteoarthritis, left hip: Secondary | ICD-10-CM

## 2019-10-30 DIAGNOSIS — N365 Urethral false passage: Secondary | ICD-10-CM | POA: Diagnosis not present

## 2019-10-30 DIAGNOSIS — Z09 Encounter for follow-up examination after completed treatment for conditions other than malignant neoplasm: Secondary | ICD-10-CM

## 2019-10-30 DIAGNOSIS — E785 Hyperlipidemia, unspecified: Secondary | ICD-10-CM | POA: Diagnosis not present

## 2019-10-30 DIAGNOSIS — M85852 Other specified disorders of bone density and structure, left thigh: Secondary | ICD-10-CM | POA: Insufficient documentation

## 2019-10-30 DIAGNOSIS — M85652 Other cyst of bone, left thigh: Secondary | ICD-10-CM | POA: Insufficient documentation

## 2019-10-30 DIAGNOSIS — Z79899 Other long term (current) drug therapy: Secondary | ICD-10-CM | POA: Diagnosis not present

## 2019-10-30 HISTORY — PX: CYSTOSCOPY: SHX5120

## 2019-10-30 HISTORY — PX: TOTAL HIP ARTHROPLASTY: SHX124

## 2019-10-30 LAB — TYPE AND SCREEN
ABO/RH(D): O POS
Antibody Screen: NEGATIVE

## 2019-10-30 LAB — CBC
HCT: 28.8 % — ABNORMAL LOW (ref 39.0–52.0)
Hemoglobin: 9.8 g/dL — ABNORMAL LOW (ref 13.0–17.0)
MCH: 31.5 pg (ref 26.0–34.0)
MCHC: 34 g/dL (ref 30.0–36.0)
MCV: 92.6 fL (ref 80.0–100.0)
Platelets: 155 10*3/uL (ref 150–400)
RBC: 3.11 MIL/uL — ABNORMAL LOW (ref 4.22–5.81)
RDW: 12.3 % (ref 11.5–15.5)
WBC: 10.2 10*3/uL (ref 4.0–10.5)
nRBC: 0 % (ref 0.0–0.2)

## 2019-10-30 LAB — ABO/RH: ABO/RH(D): O POS

## 2019-10-30 SURGERY — ARTHROPLASTY, HIP, TOTAL, ANTERIOR APPROACH
Anesthesia: Spinal | Site: Hip | Laterality: Left

## 2019-10-30 MED ORDER — CEFAZOLIN SODIUM-DEXTROSE 2-4 GM/100ML-% IV SOLN
2.0000 g | INTRAVENOUS | Status: AC
  Administered 2019-10-30: 2 g via INTRAVENOUS
  Filled 2019-10-30: qty 100

## 2019-10-30 MED ORDER — SODIUM CHLORIDE (PF) 0.9 % IJ SOLN
INTRAMUSCULAR | Status: DC | PRN
  Administered 2019-10-30: 30 mL

## 2019-10-30 MED ORDER — 0.9 % SODIUM CHLORIDE (POUR BTL) OPTIME
TOPICAL | Status: DC | PRN
  Administered 2019-10-30: 1000 mL

## 2019-10-30 MED ORDER — POVIDONE-IODINE 10 % EX SWAB: 2.0000 "application " | Freq: Once | CUTANEOUS | Status: DC

## 2019-10-30 MED ORDER — PHENYLEPHRINE 40 MCG/ML (10ML) SYRINGE FOR IV PUSH (FOR BLOOD PRESSURE SUPPORT): PREFILLED_SYRINGE | INTRAVENOUS | Status: DC | PRN

## 2019-10-30 MED ORDER — FENTANYL CITRATE (PF) 100 MCG/2ML IJ SOLN
INTRAMUSCULAR | Status: DC | PRN
  Administered 2019-10-30 (×2): 50 ug via INTRAVENOUS

## 2019-10-30 MED ORDER — POVIDONE-IODINE 10 % EX SWAB
2.0000 "application " | Freq: Once | CUTANEOUS | Status: AC
  Administered 2019-10-30: 2 via TOPICAL

## 2019-10-30 MED ORDER — HYDROCODONE-ACETAMINOPHEN 5-325 MG PO TABS
1.0000 | ORAL_TABLET | ORAL | 0 refills | Status: DC | PRN
Start: 2019-10-30 — End: 2020-02-06

## 2019-10-30 MED ORDER — CEFAZOLIN SODIUM-DEXTROSE 2-4 GM/100ML-% IV SOLN: 2.0000 g | INTRAVENOUS | Status: DC

## 2019-10-30 MED ORDER — HYDROMORPHONE HCL 1 MG/ML IJ SOLN: 0.2500 mg | INTRAMUSCULAR | Status: DC | PRN

## 2019-10-30 MED ORDER — KETOROLAC TROMETHAMINE 15 MG/ML IJ SOLN
INTRAMUSCULAR | Status: AC
  Filled 2019-10-30: qty 1

## 2019-10-30 MED ORDER — HYDROCODONE-ACETAMINOPHEN 5-325 MG PO TABS: 1.0000 | ORAL_TABLET | ORAL | Status: DC | PRN

## 2019-10-30 MED ORDER — PROPOFOL 10 MG/ML IV BOLUS
INTRAVENOUS | Status: DC | PRN
  Administered 2019-10-30: 100 ug/kg/min via INTRAVENOUS

## 2019-10-30 MED ORDER — ACETAMINOPHEN 500 MG PO TABS: 1000.0000 mg | ORAL_TABLET | Freq: Once | ORAL | Status: DC

## 2019-10-30 MED ORDER — OXYCODONE HCL 5 MG PO TABS: 5.0000 mg | ORAL_TABLET | Freq: Once | ORAL | Status: DC | PRN

## 2019-10-30 MED ORDER — ORAL CARE MOUTH RINSE: 15.0000 mL | Freq: Once | OROMUCOSAL | Status: AC

## 2019-10-30 MED ORDER — MEPERIDINE HCL 50 MG/ML IJ SOLN: 6.2500 mg | INTRAMUSCULAR | Status: DC | PRN

## 2019-10-30 MED ORDER — HYDROCODONE-ACETAMINOPHEN 7.5-325 MG PO TABS: 1.0000 | ORAL_TABLET | ORAL | Status: DC | PRN

## 2019-10-30 MED ORDER — ASPIRIN 81 MG PO CHEW: 81.0000 mg | CHEWABLE_TABLET | Freq: Two times a day (BID) | ORAL | 0 refills | Status: AC

## 2019-10-30 MED ORDER — BUPIVACAINE-EPINEPHRINE 0.25% -1:200000 IJ SOLN
INTRAMUSCULAR | Status: DC | PRN
  Administered 2019-10-30: 30 mL

## 2019-10-30 MED ORDER — SODIUM CHLORIDE (PF) 0.9 % IJ SOLN
INTRAMUSCULAR | Status: AC
  Filled 2019-10-30: qty 50

## 2019-10-30 MED ORDER — DEXAMETHASONE SODIUM PHOSPHATE 10 MG/ML IJ SOLN
INTRAMUSCULAR | Status: AC
  Filled 2019-10-30: qty 1

## 2019-10-30 MED ORDER — PROPOFOL 1000 MG/100ML IV EMUL
INTRAVENOUS | Status: AC
  Filled 2019-10-30: qty 100

## 2019-10-30 MED ORDER — FENTANYL CITRATE (PF) 100 MCG/2ML IJ SOLN
INTRAMUSCULAR | Status: AC
  Filled 2019-10-30: qty 2

## 2019-10-30 MED ORDER — ALBUMIN HUMAN 5 % IV SOLN
INTRAVENOUS | Status: AC
  Filled 2019-10-30: qty 250

## 2019-10-30 MED ORDER — MORPHINE SULFATE (PF) 4 MG/ML IV SOLN: 0.5000 mg | INTRAVENOUS | Status: DC | PRN

## 2019-10-30 MED ORDER — PHENYLEPHRINE HCL-NACL 10-0.9 MG/250ML-% IV SOLN
20.0000 ug/min | INTRAVENOUS | Status: DC
  Administered 2019-10-30: 20 ug/min via INTRAVENOUS

## 2019-10-30 MED ORDER — SODIUM CHLORIDE 0.9 % IV SOLN: INTRAVENOUS | Status: DC

## 2019-10-30 MED ORDER — METOCLOPRAMIDE HCL 5 MG PO TABS
5.0000 mg | ORAL_TABLET | Freq: Three times a day (TID) | ORAL | Status: DC | PRN
  Filled 2019-10-30: qty 2

## 2019-10-30 MED ORDER — OXYCODONE HCL 5 MG/5ML PO SOLN: 5.0000 mg | Freq: Once | ORAL | Status: DC | PRN

## 2019-10-30 MED ORDER — PROPOFOL 10 MG/ML IV BOLUS: INTRAVENOUS | Status: DC | PRN

## 2019-10-30 MED ORDER — PROMETHAZINE HCL 25 MG/ML IJ SOLN: 6.2500 mg | INTRAMUSCULAR | Status: DC | PRN

## 2019-10-30 MED ORDER — ACETAMINOPHEN 10 MG/ML IV SOLN: 1000.0000 mg | Freq: Once | INTRAVENOUS | Status: DC

## 2019-10-30 MED ORDER — DEXAMETHASONE SODIUM PHOSPHATE 10 MG/ML IJ SOLN
INTRAMUSCULAR | Status: DC | PRN
  Administered 2019-10-30: 5 mg via INTRAVENOUS

## 2019-10-30 MED ORDER — EPHEDRINE SULFATE-NACL 50-0.9 MG/10ML-% IV SOSY
PREFILLED_SYRINGE | INTRAVENOUS | Status: DC | PRN
  Administered 2019-10-30 (×6): 5 mg via INTRAVENOUS

## 2019-10-30 MED ORDER — ONDANSETRON HCL 4 MG/2ML IJ SOLN: 4.0000 mg | Freq: Four times a day (QID) | INTRAMUSCULAR | Status: DC | PRN

## 2019-10-30 MED ORDER — MIDAZOLAM HCL 2 MG/2ML IJ SOLN
INTRAMUSCULAR | Status: AC
  Filled 2019-10-30: qty 2

## 2019-10-30 MED ORDER — ALBUMIN HUMAN 5 % IV SOLN
12.5000 g | Freq: Once | INTRAVENOUS | Status: AC
  Administered 2019-10-30: 12.5 g via INTRAVENOUS

## 2019-10-30 MED ORDER — DOCUSATE SODIUM 100 MG PO CAPS: 100.0000 mg | ORAL_CAPSULE | Freq: Two times a day (BID) | ORAL | 1 refills | Status: AC

## 2019-10-30 MED ORDER — ONDANSETRON HCL 4 MG PO TABS: 4.0000 mg | ORAL_TABLET | Freq: Three times a day (TID) | ORAL | 0 refills | Status: DC | PRN

## 2019-10-30 MED ORDER — LACTATED RINGERS IV SOLN: INTRAVENOUS | Status: DC

## 2019-10-30 MED ORDER — BUPIVACAINE-EPINEPHRINE (PF) 0.25% -1:200000 IJ SOLN
INTRAMUSCULAR | Status: AC
  Filled 2019-10-30: qty 30

## 2019-10-30 MED ORDER — ISOPROPYL ALCOHOL 70 % SOLN
Status: DC | PRN
  Administered 2019-10-30: 1 via TOPICAL

## 2019-10-30 MED ORDER — LACTATED RINGERS IV BOLUS
500.0000 mL | Freq: Once | INTRAVENOUS | Status: AC
  Administered 2019-10-30: 500 mL via INTRAVENOUS

## 2019-10-30 MED ORDER — EPHEDRINE 5 MG/ML INJ
INTRAVENOUS | Status: AC
  Filled 2019-10-30: qty 10

## 2019-10-30 MED ORDER — MIDAZOLAM HCL 2 MG/2ML IJ SOLN: 0.5000 mg | Freq: Once | INTRAMUSCULAR | Status: DC | PRN

## 2019-10-30 MED ORDER — SODIUM CHLORIDE 0.9 % IR SOLN
Status: DC | PRN
  Administered 2019-10-30: 1000 mL

## 2019-10-30 MED ORDER — ACETAMINOPHEN 325 MG PO TABS: 325.0000 mg | ORAL_TABLET | Freq: Four times a day (QID) | ORAL | Status: DC | PRN

## 2019-10-30 MED ORDER — CIPROFLOXACIN HCL 500 MG PO TABS: 500.0000 mg | ORAL_TABLET | Freq: Two times a day (BID) | ORAL | 0 refills | Status: AC

## 2019-10-30 MED ORDER — METHOCARBAMOL 500 MG PO TABS: 500.0000 mg | ORAL_TABLET | Freq: Four times a day (QID) | ORAL | Status: DC | PRN

## 2019-10-30 MED ORDER — ACETAMINOPHEN 10 MG/ML IV SOLN
1000.0000 mg | Freq: Once | INTRAVENOUS | Status: AC
  Administered 2019-10-30: 1000 mg via INTRAVENOUS
  Filled 2019-10-30: qty 100

## 2019-10-30 MED ORDER — KETOROLAC TROMETHAMINE 30 MG/ML IJ SOLN
INTRAMUSCULAR | Status: DC | PRN
  Administered 2019-10-30: 30 mg

## 2019-10-30 MED ORDER — ONDANSETRON HCL 4 MG/2ML IJ SOLN
INTRAMUSCULAR | Status: AC
  Filled 2019-10-30: qty 2

## 2019-10-30 MED ORDER — WATER FOR IRRIGATION, STERILE IR SOLN
Status: DC | PRN
  Administered 2019-10-30: 2000 mL

## 2019-10-30 MED ORDER — CHLORHEXIDINE GLUCONATE 0.12 % MT SOLN
15.0000 mL | Freq: Once | OROMUCOSAL | Status: AC
  Administered 2019-10-30: 15 mL via OROMUCOSAL

## 2019-10-30 MED ORDER — TRANEXAMIC ACID-NACL 1000-0.7 MG/100ML-% IV SOLN
1000.0000 mg | INTRAVENOUS | Status: AC
  Administered 2019-10-30: 1000 mg via INTRAVENOUS
  Filled 2019-10-30: qty 100

## 2019-10-30 MED ORDER — PHENYLEPHRINE HCL-NACL 10-0.9 MG/250ML-% IV SOLN
INTRAVENOUS | Status: DC | PRN
  Administered 2019-10-30: 50 ug/min via INTRAVENOUS

## 2019-10-30 MED ORDER — BUPIVACAINE IN DEXTROSE 0.75-8.25 % IT SOLN
INTRATHECAL | Status: DC | PRN
  Administered 2019-10-30: 1.8 mL via INTRATHECAL

## 2019-10-30 MED ORDER — SODIUM CHLORIDE 0.9 % IR SOLN
Status: DC | PRN
  Administered 2019-10-30: 3000 mL

## 2019-10-30 MED ORDER — ONDANSETRON HCL 4 MG/2ML IJ SOLN
INTRAMUSCULAR | Status: DC | PRN
  Administered 2019-10-30: 4 mg via INTRAVENOUS

## 2019-10-30 MED ORDER — ONDANSETRON HCL 4 MG PO TABS
4.0000 mg | ORAL_TABLET | Freq: Four times a day (QID) | ORAL | Status: DC | PRN
  Filled 2019-10-30: qty 1

## 2019-10-30 MED ORDER — KETOROLAC TROMETHAMINE 15 MG/ML IJ SOLN
7.5000 mg | Freq: Four times a day (QID) | INTRAMUSCULAR | Status: DC
  Administered 2019-10-30: 7.5 mg via INTRAVENOUS

## 2019-10-30 MED ORDER — MELOXICAM 15 MG PO TABS: 15.0000 mg | ORAL_TABLET | Freq: Every day | ORAL | 3 refills | Status: DC

## 2019-10-30 MED ORDER — METOCLOPRAMIDE HCL 5 MG/ML IJ SOLN: 5.0000 mg | Freq: Three times a day (TID) | INTRAMUSCULAR | Status: DC | PRN

## 2019-10-30 MED ORDER — CEFAZOLIN SODIUM-DEXTROSE 2-4 GM/100ML-% IV SOLN: 2.0000 g | Freq: Four times a day (QID) | INTRAVENOUS | Status: DC

## 2019-10-30 MED ORDER — METHOCARBAMOL 500 MG IVPB - SIMPLE MED: 500.0000 mg | Freq: Four times a day (QID) | INTRAVENOUS | Status: DC | PRN

## 2019-10-30 MED ORDER — MIDAZOLAM HCL 5 MG/5ML IJ SOLN
INTRAMUSCULAR | Status: DC | PRN
  Administered 2019-10-30: 2 mg via INTRAVENOUS

## 2019-10-30 MED ORDER — KETOROLAC TROMETHAMINE 30 MG/ML IJ SOLN
INTRAMUSCULAR | Status: AC
  Filled 2019-10-30: qty 1

## 2019-10-30 MED ORDER — TRANEXAMIC ACID-NACL 1000-0.7 MG/100ML-% IV SOLN: 1000.0000 mg | INTRAVENOUS | Status: DC

## 2019-10-30 MED ORDER — SENNA 8.6 MG PO TABS: 2.0000 | ORAL_TABLET | Freq: Every day | ORAL | 1 refills | Status: AC

## 2019-10-30 MED ORDER — PHENYLEPHRINE HCL (PRESSORS) 10 MG/ML IV SOLN
INTRAVENOUS | Status: AC
  Filled 2019-10-30: qty 1

## 2019-10-30 SURGICAL SUPPLY — 72 items
ADH SKN CLS APL DERMABOND .7 (GAUZE/BANDAGES/DRESSINGS) ×2
APL PRP STRL LF DISP 70% ISPRP (MISCELLANEOUS) ×2
BAG DECANTER FOR FLEXI CONT (MISCELLANEOUS) IMPLANT
BAG SPEC THK2 15X12 ZIP CLS (MISCELLANEOUS)
BAG ZIPLOCK 12X15 (MISCELLANEOUS) IMPLANT
BLADE SURG SZ10 CARB STEEL (BLADE) IMPLANT
CATH SILASTIC FOLEY 18FRX30CC (CATHETERS) ×1 IMPLANT
CHLORAPREP W/TINT 26 (MISCELLANEOUS) ×3 IMPLANT
COVER PERINEAL POST (MISCELLANEOUS) ×3 IMPLANT
COVER SURGICAL LIGHT HANDLE (MISCELLANEOUS) ×3 IMPLANT
COVER WAND RF STERILE (DRAPES) IMPLANT
CUP ACETAB 56MM (Orthopedic Implant) ×1 IMPLANT
DECANTER SPIKE VIAL GLASS SM (MISCELLANEOUS) ×3 IMPLANT
DERMABOND ADVANCED (GAUZE/BANDAGES/DRESSINGS) ×1
DERMABOND ADVANCED .7 DNX12 (GAUZE/BANDAGES/DRESSINGS) ×4 IMPLANT
DRAPE IMP U-DRAPE 54X76 (DRAPES) ×3 IMPLANT
DRAPE SHEET LG 3/4 BI-LAMINATE (DRAPES) ×9 IMPLANT
DRAPE STERI IOBAN 125X83 (DRAPES) IMPLANT
DRAPE U-SHAPE 47X51 STRL (DRAPES) ×6 IMPLANT
DRESSING AQUACEL AG SP 3.5X10 (GAUZE/BANDAGES/DRESSINGS) IMPLANT
DRSG AQUACEL AG ADV 3.5X10 (GAUZE/BANDAGES/DRESSINGS) ×3 IMPLANT
DRSG AQUACEL AG SP 3.5X10 (GAUZE/BANDAGES/DRESSINGS) ×3
ELECT REM PT RETURN 15FT ADLT (MISCELLANEOUS) ×3 IMPLANT
GAUZE SPONGE 4X4 12PLY STRL (GAUZE/BANDAGES/DRESSINGS) ×3 IMPLANT
GLOVE BIO SURGEON STRL SZ8.5 (GLOVE) ×6 IMPLANT
GLOVE BIOGEL M STRL SZ7.5 (GLOVE) ×6 IMPLANT
GLOVE BIOGEL PI IND STRL 8 (GLOVE) ×2 IMPLANT
GLOVE BIOGEL PI IND STRL 8.5 (GLOVE) ×2 IMPLANT
GLOVE BIOGEL PI INDICATOR 8 (GLOVE) ×1
GLOVE BIOGEL PI INDICATOR 8.5 (GLOVE) ×1
GOWN SPEC L3 XXLG W/TWL (GOWN DISPOSABLE) ×3 IMPLANT
GOWN STRL REUS W/ TWL LRG LVL3 (GOWN DISPOSABLE) ×2 IMPLANT
GOWN STRL REUS W/TWL LRG LVL3 (GOWN DISPOSABLE) ×3
HANDPIECE INTERPULSE COAX TIP (DISPOSABLE) ×3
HEAD CERAMIC DELTA 36 PLUS 1.5 (Hips) ×1 IMPLANT
HOLDER FOLEY CATH W/STRAP (MISCELLANEOUS) ×3 IMPLANT
HOOD PEEL AWAY FLYTE STAYCOOL (MISCELLANEOUS) ×12 IMPLANT
JET LAVAGE IRRISEPT WOUND (IRRIGATION / IRRIGATOR) ×3
KIT TURNOVER KIT A (KITS) IMPLANT
LAVAGE JET IRRISEPT WOUND (IRRIGATION / IRRIGATOR) ×2 IMPLANT
MANIFOLD NEPTUNE II (INSTRUMENTS) ×3 IMPLANT
MARKER SKIN DUAL TIP RULER LAB (MISCELLANEOUS) ×3 IMPLANT
NDL SAFETY ECLIPSE 18X1.5 (NEEDLE) ×2 IMPLANT
NDL SPNL 18GX3.5 QUINCKE PK (NEEDLE) ×2 IMPLANT
NEEDLE HYPO 18GX1.5 SHARP (NEEDLE) ×3
NEEDLE SPNL 18GX3.5 QUINCKE PK (NEEDLE) ×3 IMPLANT
PACK ANTERIOR HIP CUSTOM (KITS) ×3 IMPLANT
PENCIL SMOKE EVACUATOR (MISCELLANEOUS) IMPLANT
PINNACLE ALTRX PLUS 4 N 36X56 (Hips) ×1 IMPLANT
SAW OSC TIP CART 19.5X105X1.3 (SAW) ×3 IMPLANT
SCREW 6.5MMX35MM (Screw) ×1 IMPLANT
SCREW 6.5MMX40MM (Screw) ×1 IMPLANT
SEALER BIPOLAR AQUA 6.0 (INSTRUMENTS) ×3 IMPLANT
SET HNDPC FAN SPRY TIP SCT (DISPOSABLE) ×2 IMPLANT
STEM TRI LOC BPS SZ6 W GRIPTON IMPLANT
SUT ETHIBOND NAB CT1 #1 30IN (SUTURE) ×6 IMPLANT
SUT MNCRL AB 3-0 PS2 18 (SUTURE) ×3 IMPLANT
SUT MNCRL AB 4-0 PS2 18 (SUTURE) ×3 IMPLANT
SUT MON AB 2-0 CT1 36 (SUTURE) ×6 IMPLANT
SUT STRATAFIX PDO 1 14 VIOLET (SUTURE) ×3
SUT STRATFX PDO 1 14 VIOLET (SUTURE) ×2
SUT VIC AB 2-0 CT1 27 (SUTURE) ×3
SUT VIC AB 2-0 CT1 TAPERPNT 27 (SUTURE) ×2 IMPLANT
SUTURE STRATFX PDO 1 14 VIOLET (SUTURE) ×2 IMPLANT
SYR 3ML LL SCALE MARK (SYRINGE) ×3 IMPLANT
SYR TOOMEY IRRIG 70ML (MISCELLANEOUS) ×3
SYRINGE TOOMEY IRRIG 70ML (MISCELLANEOUS) IMPLANT
TRAY FOLEY MTR SLVR 16FR STAT (SET/KITS/TRAYS/PACK) IMPLANT
TRI LOC BPS SZ 6 W GRIPTON ×3 IMPLANT
TUBE SUCTION HIGH CAP CLEAR NV (SUCTIONS) ×3 IMPLANT
WATER STERILE IRR 1000ML POUR (IV SOLUTION) ×3 IMPLANT
YANKAUER SUCT BULB TIP 10FT TU (MISCELLANEOUS) ×3 IMPLANT

## 2019-10-30 NOTE — Op Note (Signed)
Operative Note  Preoperative diagnosis:  1.  Traumatic foley  Postoperative diagnosis: 1.  Uretrhral false passage  Procedure(s): 1.  Cystoscopy with difficult foley placement  Surgeon: Kasandra Knudsen, MD  Assistants:  None  Anesthesia:  General  Complications:  None  EBL:  0  Specimens: none  Drains/Catheters: 1.  16Fr silastic foley  Intraoperative findings:   1. False passage noted in bulbar urethra just distal to verumontanum  Indication:  Patrick Moreno is a 64 y.o. male with hip arthritis in OR for left THA with Dr. Linna Caprice.    Description of procedure:  The patient was already under anesthesia when Urology was called.  Attempted at placing 20Fr coude had been previously attempted.    The patient was prepped and draped in sterile fashion.  A flexible cystoscope was placed in urethral meatus and advanced until a false passage was seen in proximal bulbar urethra.  The scope was navigated past the passage and into the bladder.  A wire was placed through the cystoscope and the scope was removed.  Attempts at placing 20Fr council tip over wire were unsuccessful.  A 16Fr silastic foley was made into council tip using 16guage angiocath needle.  It was then successfully placed over wire into bladder and inflated with 10 cc sterile water.  The wire was removed.  The patient was turned back over to orthopedics.    Plan: will arrange follow up at [facility] for void trial in 1 week

## 2019-10-30 NOTE — Anesthesia Postprocedure Evaluation (Signed)
Anesthesia Post Note  Patient: Patrick Moreno  Procedure(s) Performed: TOTAL HIP ARTHROPLASTY ANTERIOR APPROACH (Left Hip) FLEXIBLE CYSTOSCOPY WITH FOLEY INSERTION     Patient location during evaluation: Phase II Anesthesia Type: Spinal Level of consciousness: awake and alert, patient cooperative and oriented Pain management: pain level controlled Vital Signs Assessment: post-procedure vital signs reviewed and stable Respiratory status: spontaneous breathing, nonlabored ventilation and respiratory function stable Cardiovascular status: stable and blood pressure returned to baseline Postop Assessment: no apparent nausea or vomiting, able to ambulate, adequate PO intake, patient able to bend at knees and spinal receding Anesthetic complications: no   No complications documented.  Last Vitals:  Vitals:   10/30/19 1455 10/30/19 1500  BP: 109/62 106/71  Pulse:    Resp:    Temp:    SpO2:      Last Pain:  Vitals:   10/30/19 1445  TempSrc:   PainSc: 0-No pain                 Jimmy Stipes,E. Fredy Gladu

## 2019-10-30 NOTE — Discharge Instructions (Signed)
°Dr. Rajah Tagliaferro °Joint Replacement Specialist °[facility] °[address] °[place], [place] [address] °[phone] ° ° °TOTAL HIP REPLACEMENT POSTOPERATIVE DIRECTIONS ° ° ° °Hip Rehabilitation, Guidelines Following Surgery  ° °WEIGHT BEARING °Weight bearing as tolerated with assist device (walker, cane, etc) as directed, use it as long as suggested by your surgeon or therapist, typically at least 4-6 weeks. ° °The results of a hip operation are greatly improved after range of motion and muscle strengthening exercises. Follow all safety measures which are given to protect your hip. If any of these exercises cause increased pain or swelling in your joint, decrease the amount until you are comfortable again. Then slowly increase the exercises. Call your caregiver if you have problems or questions.  ° °HOME CARE INSTRUCTIONS  °Most of the following instructions are designed to prevent the dislocation of your new hip.  °Remove items at home which could result in a fall. This includes throw rugs or furniture in walking pathways.  °Continue medications as instructed at time of discharge. °· You may have some home medications which will be placed on hold until you complete the course of blood thinner medication. °· You may start showering once you are discharged home. Do not remove your dressing. °Do not put on socks or shoes without following the instructions of your caregivers.   °Sit on chairs with arms. Use the chair arms to help push yourself up when arising.  °Arrange for the use of a toilet seat elevator so you are not sitting low.  °· Walk with walker as instructed.  °You may resume a sexual relationship in one month or when given the OK by your caregiver.  °Use walker as long as suggested by your caregivers.  °You may put full weight on your legs and walk as much as is comfortable. °Avoid periods of inactivity such as sitting longer than an hour when not asleep. This helps prevent  blood clots.  °You may return to work once you are cleared by your surgeon.  °Do not drive a car for 6 weeks or until released by your surgeon.  °Do not drive while taking narcotics.  °Wear elastic stockings for two weeks following surgery during the day but you may remove then at night.  °Make sure you keep all of your appointments after your operation with all of your doctors and caregivers. You should call the office at the above phone number and make an appointment for approximately two weeks after the date of your surgery. °Please pick up a stool softener and laxative for home use as long as you are requiring pain medications. °· ICE to the affected hip every three hours for 30 minutes at a time and then as needed for pain and swelling. Continue to use ice on the hip for pain and swelling from surgery. You may notice swelling that will progress down to the foot and ankle.  This is normal after surgery.  Elevate the leg when you are not up walking on it.   °It is important for you to complete the blood thinner medication as prescribed by your doctor. °· Continue to use the breathing machine which will help keep your temperature down.  It is common for your temperature to cycle up and down following surgery, especially at night when you are not up moving around and exerting yourself.  The breathing machine keeps your lungs expanded and your temperature down. ° °RANGE OF MOTION AND STRENGTHENING EXERCISES  °These exercises are   designed to help you keep full movement of your hip joint. Follow your caregiver's or physical therapist's instructions. Perform all exercises about fifteen times, three times per day or as directed. Exercise both hips, even if you have had only one joint replacement. These exercises can be done on a training (exercise) mat, on the floor, on a table or on a bed. Use whatever works the best and is most comfortable for you. Use music or television while you are exercising so that the exercises  are a pleasant break in your day. This will make your life better with the exercises acting as a break in routine you can look forward to.  °Lying on your back, slowly slide your foot toward your buttocks, raising your knee up off the floor. Then slowly slide your foot back down until your leg is straight again.  °Lying on your back spread your legs as far apart as you can without causing discomfort.  °Lying on your side, raise your upper leg and foot straight up from the floor as far as is comfortable. Slowly lower the leg and repeat.  °Lying on your back, tighten up the muscle in the front of your thigh (quadriceps muscles). You can do this by keeping your leg straight and trying to raise your heel off the floor. This helps strengthen the largest muscle supporting your knee.  °Lying on your back, tighten up the muscles of your buttocks both with the legs straight and with the knee bent at a comfortable angle while keeping your heel on the floor.  ° °SKILLED REHAB INSTRUCTIONS: °If the patient is transferred to a skilled rehab facility following release from the hospital, a list of the current medications will be sent to the facility for the patient to continue.  When discharged from the skilled rehab facility, please have the facility set up the patient's Home Health Physical Therapy prior to being released. Also, the skilled facility will be responsible for providing the patient with their medications at time of release from the facility to include their pain medication and their blood thinner medication. If the patient is still at the rehab facility at time of the two week follow up appointment, the skilled rehab facility will also need to assist the patient in arranging follow up appointment in our office and any transportation needs. ° °MAKE SURE YOU:  °Understand these instructions.  °Will watch your condition.  °Will get help right away if you are not doing well or get worse. ° °Pick up stool softner and  laxative for home use following surgery while on pain medications. °Do not remove your dressing. °The dressing is waterproof--it is OK to take showers. °Continue to use ice for pain and swelling after surgery. °Do not use any lotions or creams on the incision until instructed by your surgeon. °Total Hip Protocol. ° ° °

## 2019-10-30 NOTE — Evaluation (Signed)
Physical Therapy Evaluation Patient Details Name: Patrick Moreno MRN: [id number] DOB: 01-08-1956 Today's Date: 10/30/2019   History of Present Illness  Patient is 64 y.o. male s/p Lt THA anterior approach on 10/30/19 with PMH significant for HTN, OA, HLD, schizoprenia.  Clinical Impression  Amarri Satterly is a 64 y.o. male POD 0 s/p Lt THA. Patient reports independence with mobility at baseline. Patient is now limited by functional impairments (see PT problem list below) and requires min guard/supervision for transfers and gait with RW. Patient was able to ambulate ~160 feet with RW and min guard/supervision and cues for safe walker management. Patient educated on safe sequencing for stair mobility and verbalized safe guarding position for people assisting with mobility. Patient instructed in exercises to facilitate ROM and circulation. Patient will benefit from continued skilled PT interventions to address impairments and progress towards PLOF. Patient has met mobility goals at adequate level for discharge home; will continue to follow if pt continues acute stay to progress towards Mod I goals.     Follow Up Recommendations Follow surgeon's recommendation for DC plan and follow-up therapies    Equipment Recommendations  Rolling walker with 5" wheels (delivered in PACU)    Recommendations for Other Services       Precautions / Restrictions Precautions Precautions: Fall Restrictions Weight Bearing Restrictions: No LLE Weight Bearing: Weight bearing as tolerated Other Position/Activity Restrictions: WBAT      Mobility  Bed Mobility Overal bed mobility: Needs Assistance Bed Mobility: Supine to Sit     Supine to sit: Supervision     General bed mobility comments: no assist required to raise trunk and bring LE's off EOB, pt requires increased time.  Transfers Overall transfer level: Needs assistance Equipment used: Rolling walker (2 wheeled) Transfers: Sit to/from Stand Sit to  Stand: Min guard;Supervision         General transfer comment: cues for safe technique with RW, no assist required to rise or steady. min guard/sup for safety.  Ambulation/Gait Ambulation/Gait assistance: Min guard;Supervision Gait Distance (Feet): 160 Feet Assistive device: Rolling walker (2 wheeled) Gait Pattern/deviations: Step-to pattern;Step-through pattern;Decreased stride length Gait velocity: decr   General Gait Details: cues for safe step pattern and proximity to RW. pt alternating between step to and step through pattern. Cues to correct and pt improved throughout.  Stairs Stairs: Yes Stairs assistance: Min guard Stair Management: No rails;Step to pattern;Forwards;With walker Number of Stairs: 1 General stair comments: verbal cues for safe step sequencing with RW. "up with good, down with bad" and cues for safe guarding postion. Pt verbalized understanding of guarding for family to provide.   Wheelchair Mobility    Modified Rankin (Stroke Patients Only)       Balance Overall balance assessment: Needs assistance Sitting-balance support: Feet supported Sitting balance-Leahy Scale: Good     Standing balance support: During functional activity;Bilateral upper extremity supported Standing balance-Leahy Scale: Fair Standing balance comment: pt standing to adjust gown, no UE support. requries external support for dynamic gait.                             Pertinent Vitals/Pain Pain Assessment: No/denies pain    Home Living Family/patient expects to be discharged to:: Private residence Living Arrangements: Alone Available Help at Discharge: Family Type of Home: Apartment Home Access: Stairs to enter Entrance Stairs-Rails: None Entrance Stairs-Number of Steps: 1 curb Home Layout: One level Home Equipment: Cane - single point Additional Comments: pt's brothers  will be staying with him and helping him while he recovers.    Prior Function Level of  Independence: Independent               Hand Dominance        Extremity/Trunk Assessment   Upper Extremity Assessment Upper Extremity Assessment: Overall WFL for tasks assessed    Lower Extremity Assessment Lower Extremity Assessment: Overall WFL for tasks assessed    Cervical / Trunk Assessment Cervical / Trunk Assessment: Kyphotic  Communication   Communication: HOH  Cognition Arousal/Alertness: Awake/alert Behavior During Therapy: WFL for tasks assessed/performed Overall Cognitive Status: Within Functional Limits for tasks assessed                                        General Comments      Exercises Total Joint Exercises Ankle Circles/Pumps: AROM;Both;15 reps;Seated Quad Sets: AROM;Left;5 reps;Seated Short Arc Quad: AROM;Left;5 reps;Seated Heel Slides: AROM;Left;5 reps;Seated Long Arc Quad: AROM;Left;5 reps;Seated   Assessment/Plan    PT Assessment Patient needs continued PT services  PT Problem List Decreased strength;Decreased range of motion;Decreased balance;Decreased activity tolerance;Decreased mobility;Decreased knowledge of use of DME;Decreased knowledge of precautions       PT Treatment Interventions DME instruction;Gait training;Stair training;Functional mobility training;Therapeutic exercise;Therapeutic activities;Balance training;Patient/family education    PT Goals (Current goals can be found in the Care Plan section)  Acute Rehab PT Goals Patient Stated Goal: return home today PT Goal Formulation: With patient Time For Goal Achievement: 11/04/19 Potential to Achieve Goals: Good    Frequency 7X/week   Barriers to discharge        Co-evaluation               AM-PAC PT "6 Clicks" Mobility  Outcome Measure Help needed turning from your back to your side while in a flat bed without using bedrails?: None Help needed moving from lying on your back to sitting on the side of a flat bed without using bedrails?:  None Help needed moving to and from a bed to a chair (including a wheelchair)?: A Little Help needed standing up from a chair using your arms (e.g., wheelchair or bedside chair)?: A Little Help needed to walk in hospital room?: A Little Help needed climbing 3-5 steps with a railing? : A Little 6 Click Score: 20    End of Session Equipment Utilized During Treatment: Gait belt Activity Tolerance: Patient tolerated treatment well Patient left: in chair;with call bell/phone within reach Nurse Communication: Mobility status PT Visit Diagnosis: Difficulty in walking, not elsewhere classified (R26.2);Muscle weakness (generalized) (M62.81)    Time: 3300-7622 PT Time Calculation (min) (ACUTE ONLY): 34 min   Charges:   PT Evaluation $PT Eval Low Complexity: 1 Low PT Treatments $Gait Training: 8-22 mins        Verner Mould, DPT Acute Rehabilitation Services  Office [phone] Pager [phone]  10/30/2019 3:49 PM

## 2019-10-30 NOTE — Op Note (Signed)
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  PROCEDURE: Left total hip arthroplasty, anterior approach.   IMPLANTS: DePuy Tri Lock stem, size 6, hi offset. DePuy Pinnacle Cup, size 56 mm. DePuy Altrx liner, size 36 by 56 mm, +4 neutral. DePuy Biolox ceramic head ball, size 36 + 1.5 mm. 6.5 mm cancellous bone screws x2.  ANESTHESIA:  Spinal  ESTIMATED BLOOD LOSS:-450 mL    ANTIBIOTICS: 2g Ancef.  DRAINS: None.  COMPLICATIONS: Traumatic foley insertion requiring urology intervention.   CONDITION: PACU - hemodynamically stable.   BRIEF CLINICAL NOTE: Patrick Moreno is a 64 y.o. male with a long-standing history of Left hip arthritis.  X-rays showed loss of acetabular bone stock and superolateral migration of hip center of rotation.  After failing conservative management, the patient was indicated for total hip arthroplasty. The risks, benefits, and alternatives to the procedure were explained, and the patient elected to proceed.  PROCEDURE IN DETAIL: Surgical site was marked by myself. Once inside the operative room, spinal anesthesia was obtained.  Foley catheter insertion was unsuccessful, so Dr Cordella Register with urology was called.  He attempted to pass a coud catheter unsuccessfully, so cystoscopy was required to place the catheter.  Please see his procedure note for further details. The patient was then positioned on the Hana table.  All bony prominences were well padded.  The hip was prepped and draped in the normal sterile surgical fashion.  A time-out was called verifying side and site of surgery. The patient received IV antibiotics within 60 minutes of beginning the procedure.   The direct anterior approach to the hip was performed through the Hueter interval.  Lateral femoral circumflex vessels  were treated with the Auqumantys. The anterior capsule was exposed and an inverted T capsulotomy was made. The femoral neck cut was made to the level of the templated cut.  A corkscrew was placed into the head and the head was removed.  The femoral head was found to have eburnated bone. The head was passed to the back table and was measured.  Acetabular exposure was achieved, and the pulvinar and labrum were excised.  There is a complex acetabular deformity with an egg shaped introitus.  There was erosion of cancellous bone stock in the posterior column.  I began with a 49 mm reamer using both direct visualization and live fluoroscopy.  Sequental reaming of the acetabulum was then performed up to a size 55 mm reamer, taking care to keep the hip center low and lateral. Bone graft was harvested from the native head using a reamer. I placed the bone graft into the posterior column. I impacted the bone graft with a 54 mm reamer on reverse. A 56 mm cup was then opened and impacted into place at approximately 40 degrees of abduction and 20 degrees of anteversion. The cup achieved excellent press fit, and I chose to augment the fixation with 6.5 mm cancellous screws x2. The final polyethylene liner was impacted into place and acetabular osteophytes were removed.    I then gained femoral exposure taking care to protect the abductors and greater trochanter.  This was performed using standard external rotation, extension, and adduction.  The capsule was peeled off the inner aspect of the greater trochanter, taking care to preserve the short external rotators. A cookie cutter was used to enter the femoral  canal, and then the femoral canal finder was placed.  Sequential broaching was performed up to a size 6.  Calcar planer was used on the femoral neck remnant.  I paced a hi offset neck and a trial head ball.  The hip was reduced.  Leg lengths and offset were checked fluoroscopically.  The hip was dislocated and trial  components were removed.  The final implants were placed, and the hip was reduced.  Fluoroscopy was used to confirm component position and leg lengths.  At 90 degrees of external rotation and full extension, the hip was stable to an anterior directed force.   The wound was copiously irrigated with Irrisept solution and normal saline using pule lavage.  Marcaine solution was injected into the periarticular soft tissue.  The wound was closed in layers using #1 strata fix for the fascia, 2-0 Vicryl for the subcutaneous fat, 2-0 Monocryl for the deep dermal layer, 3-0 running Monocryl subcuticular stitch, and Dermabond for the skin.  Once the glue was fully dried, an Aquacell Ag dressing was applied.  The patient was transported to the recovery room in stable condition.  Sponge, needle, and instrument counts were correct at the end of the case x2.  The patient tolerated the procedure well and there were no known complications.  Please note that a surgical assistant was a medical necessity for this procedure to perform it in a safe and expeditious manner. Assistant was necessary to provide appropriate retraction of vital neurovascular structures, to prevent femoral fracture, and to allow for anatomic placement of the prosthesis.  POSTOPERATIVE PLAN: We will plan for discharge home from the postanesthesia care unit.  He may weight-bear as tolerated with assist device.  Mobilize out of bed with physical therapy.  Patient will be discharged with Foley catheter.  He will need to call [facility] to schedule a follow-up appointment for a voiding trial next week.  Routine orthopedic postop care in 2 weeks.

## 2019-10-30 NOTE — Interval H&P Note (Signed)
History and Physical Interval Note:  10/30/2019 7:06 AM  Patrick Moreno  has presented today for surgery, with the diagnosis of Left hip osteoarthritis.  The various methods of treatment have been discussed with the patient and family. After consideration of risks, benefits and other options for treatment, the patient has consented to  Procedure(s): TOTAL HIP ARTHROPLASTY ANTERIOR APPROACH (Left) as a surgical intervention.  The patient's history has been reviewed, patient examined, no change in status, stable for surgery.  I have reviewed the patient's chart and labs.  Questions were answered to the patient's satisfaction.     Iline Oven Anvita Hirata

## 2019-10-30 NOTE — Anesthesia Preprocedure Evaluation (Addendum)
Anesthesia Evaluation  Patient identified by MRN, date of birth, ID band Patient awake    Reviewed: Allergy & Precautions, NPO status , Patient's Chart, lab work & pertinent test results, reviewed documented beta blocker date and time   Airway Mallampati: II  TM Distance: >3 FB Neck ROM: Full    Dental  (+) Dental Advisory Given, Teeth Intact   Pulmonary neg pulmonary ROS,  10/26/2019 SARS coronavirus NEG   Pulmonary exam normal breath sounds clear to auscultation       Cardiovascular hypertension, Pt. on medications and Pt. on home beta blockers Normal cardiovascular exam Rhythm:Regular Rate:Normal     Neuro/Psych PSYCHIATRIC DISORDERS Schizophrenia negative neurological ROS     GI/Hepatic negative GI ROS, Neg liver ROS,   Endo/Other  negative endocrine ROS  Renal/GU negative Renal ROS     Musculoskeletal negative musculoskeletal ROS (+) Arthritis , Osteoarthritis,    Abdominal   Peds  Hematology negative hematology ROS (+)   Anesthesia Other Findings   Reproductive/Obstetrics                            Anesthesia Physical Anesthesia Plan  ASA: III  Anesthesia Plan: Spinal   Post-op Pain Management:    Induction:   PONV Risk Score and Plan: 1 and Ondansetron and Dexamethasone  Airway Management Planned: Natural Airway and Simple Face Mask  Additional Equipment: None  Intra-op Plan:   Post-operative Plan:   Informed Consent: I have reviewed the patients History and Physical, chart, labs and discussed the procedure including the risks, benefits and alternatives for the proposed anesthesia with the patient or authorized representative who has indicated his/her understanding and acceptance.     Dental advisory given  Plan Discussed with: CRNA  Anesthesia Plan Comments:        Anesthesia Quick Evaluation

## 2019-10-30 NOTE — Anesthesia Procedure Notes (Addendum)
Spinal  Patient location during procedure: OR Start time: 10/30/2019 7:35 AM End time: 10/30/2019 7:38 AM Staffing Anesthesiologist: Lewie Loron, MD Preanesthetic Checklist Completed: patient identified, IV checked, site marked, risks and benefits discussed, surgical consent, monitors and equipment checked, pre-op evaluation and timeout performed Spinal Block Patient position: sitting Prep: DuraPrep and site prepped and draped Patient monitoring: heart rate, cardiac monitor, continuous pulse ox and blood pressure Approach: midline Location: L3-4 Injection technique: single-shot Needle Needle type: Sprotte  Needle gauge: 24 G Needle length: 9 cm Assessment Sensory level: T4

## 2019-10-30 NOTE — Transfer of Care (Signed)
Immediate Anesthesia Transfer of Care Note  Patient: Patrick Moreno  Procedure(s) Performed: TOTAL HIP ARTHROPLASTY ANTERIOR APPROACH (Left Hip) FLEXIBLE CYSTOSCOPY WITH FOLEY INSERTION  Patient Location: PACU  Anesthesia Type:Spinal  Level of Consciousness: awake and patient cooperative  Airway & Oxygen Therapy: Patient Spontanous Breathing and Patient connected to face mask  Post-op Assessment: Report given to RN and Post -op Vital signs reviewed and stable  Post vital signs: Reviewed and stable  Last Vitals:  Vitals Value Taken Time  BP 95/65 10/30/19 1107  Temp    Pulse 67 10/30/19 1110  Resp 18 10/30/19 1110  SpO2 100 % 10/30/19 1110  Vitals shown include unvalidated device data.  Last Pain:  Vitals:   10/30/19 0614  TempSrc:   PainSc: 2          Complications: No complications documented.

## 2019-10-31 ENCOUNTER — Encounter (HOSPITAL_COMMUNITY): Payer: Self-pay | Admitting: Orthopedic Surgery

## 2019-11-05 DIAGNOSIS — R338 Other retention of urine: Secondary | ICD-10-CM | POA: Diagnosis not present

## 2019-11-12 DIAGNOSIS — Z96642 Presence of left artificial hip joint: Secondary | ICD-10-CM | POA: Diagnosis not present

## 2019-11-12 DIAGNOSIS — Z471 Aftercare following joint replacement surgery: Secondary | ICD-10-CM | POA: Diagnosis not present

## 2019-11-25 ENCOUNTER — Telehealth (INDEPENDENT_AMBULATORY_CARE_PROVIDER_SITE_OTHER): Payer: Medicare Other | Admitting: Psychiatry

## 2019-11-25 ENCOUNTER — Encounter (HOSPITAL_COMMUNITY): Payer: Self-pay | Admitting: Psychiatry

## 2019-11-25 ENCOUNTER — Other Ambulatory Visit: Payer: Self-pay

## 2019-11-25 DIAGNOSIS — F200 Paranoid schizophrenia: Secondary | ICD-10-CM

## 2019-11-25 MED ORDER — RISPERIDONE 1 MG PO TABS: ORAL_TABLET | ORAL | 0 refills | Status: DC

## 2019-11-25 NOTE — Progress Notes (Signed)
Virtual Visit via Telephone Note  I connected with Patrick Moreno on 11/25/19 at  9:00 AM EDT by telephone and verified that I am speaking with the correct person using two identifiers.  Location: Patient: home Provider: home office   I discussed the limitations, risks, security and privacy concerns of performing an evaluation and management service by telephone and the availability of in person appointments. I also discussed with the patient that there may be a patient responsible charge related to this service. The patient expressed understanding and agreed to proceed.   History of Present Illness: Patient is evaluated by phone session.  She recently had total hip replacement on his left hip and now he is recovering much better.  He is started driving slowly and gradually.  He had lost weight in recent months.  He is doing much better since we cut down the Risperdal.  He is taking 2 mg twice a day.  He admitted that his thinking is much clearer and he has more energy.  He denies any paranoia, hallucination or any suicidal thoughts.  He has no tremors or shakes.  He was pleased because his brother from [place] in [place] came to help him when he went for hip replacement.  Patient denies any hallucination, paranoia, anxiety.  He had a good support from his nephew who lives in [place], niece in [place].  He lives by himself.  He has appointment to see his PCP Benedetto Goad on November 10.     Past Psychiatric History: H/Oschizophrenia since 34. H/Opsychotic episode whileworking in [place]. No h/o inpatient orsuicidalattempt. H/OtakingStelazine.    Psychiatric Specialty Exam: Physical Exam  Review of Systems  Weight 170 lb (77.1 kg).There is no height or weight on file to calculate BMI.  General Appearance: NA  Eye Contact:  NA  Speech:  Slow  Volume:  Normal  Mood:  Euthymic  Affect:  NA  Thought Process:  Goal Directed  Orientation:  Full (Time, Place, and  Person)  Thought Content:  Logical  Suicidal Thoughts:  No  Homicidal Thoughts:  No  Memory:  Immediate;   Good Recent;   Good Remote;   Good  Judgement:  Good  Insight:  Present  Psychomotor Activity:  NA  Concentration:  Concentration: Fair and Attention Span: Fair  Recall:  Good  Fund of Knowledge:  Good  Language:  Good  Akathisia:  No  Handed:  Right  AIMS (if indicated):     Assets:  Communication Skills Desire for Improvement Housing Resilience Social Support Transportation  ADL's:  Intact  Cognition:  WNL  Sleep:   ok      Assessment and Plan: Schizophrenia chronic paranoid type.  Patient doing much better and recently had left total hip surgery and hoping to have on the right side soon.  We talked about further cutting down his medication and he agreed since he noticed improvement in his energy and help to lose his weight.  He is currently taking 2 mg of Risperdal twice a day.  I recommend to try Risperdal 1 mg in the morning and 2 mg at bedtime.  However I recommend if he started to notice that he is feeling more paranoid and anxious then he need to call us immediately.  He agreed with the plan.  Follow-up in 3 months.  New dose will be 1 mg Respinol in the morning and 2 mg at bedtime.  Follow Up Instructions:    I discussed the assessment and treatment plan  with the patient. The patient was provided an opportunity to ask questions and all were answered. The patient agreed with the plan and demonstrated an understanding of the instructions.   The patient was advised to call back or seek an in-person evaluation if the symptoms worsen or if the condition fails to improve as anticipated.  I provided 14 minutes of non-face-to-face time during this encounter.   Cleotis Nipper, MD

## 2019-11-30 DIAGNOSIS — Z23 Encounter for immunization: Secondary | ICD-10-CM | POA: Diagnosis not present

## 2019-12-04 ENCOUNTER — Other Ambulatory Visit (HOSPITAL_COMMUNITY): Payer: Self-pay | Admitting: *Deleted

## 2019-12-04 DIAGNOSIS — F200 Paranoid schizophrenia: Secondary | ICD-10-CM

## 2019-12-04 MED ORDER — RISPERIDONE 2 MG PO TABS: 2.0000 mg | ORAL_TABLET | Freq: Every day | ORAL | 0 refills | Status: DC

## 2019-12-04 MED ORDER — RISPERIDONE 1 MG PO TABS: ORAL_TABLET | ORAL | 0 refills | Status: DC

## 2019-12-10 DIAGNOSIS — Z471 Aftercare following joint replacement surgery: Secondary | ICD-10-CM | POA: Diagnosis not present

## 2019-12-10 DIAGNOSIS — Z96642 Presence of left artificial hip joint: Secondary | ICD-10-CM | POA: Diagnosis not present

## 2019-12-11 DIAGNOSIS — Z23 Encounter for immunization: Secondary | ICD-10-CM | POA: Diagnosis not present

## 2019-12-11 DIAGNOSIS — F200 Paranoid schizophrenia: Secondary | ICD-10-CM | POA: Diagnosis not present

## 2019-12-11 DIAGNOSIS — M1611 Unilateral primary osteoarthritis, right hip: Secondary | ICD-10-CM | POA: Diagnosis not present

## 2019-12-11 DIAGNOSIS — I10 Essential (primary) hypertension: Secondary | ICD-10-CM | POA: Diagnosis not present

## 2019-12-23 ENCOUNTER — Ambulatory Visit: Payer: Self-pay | Admitting: Student

## 2020-02-04 ENCOUNTER — Ambulatory Visit: Payer: Self-pay | Admitting: Student

## 2020-02-04 NOTE — H&P (Signed)
TOTAL HIP ADMISSION H&P  Patient is admitted for right total hip arthroplasty.  Subjective:  Chief Complaint: right hip pain  HPI: Patrick Moreno, 65 y.o. male, has a history of pain and functional disability in the right hip(s) due to arthritis and patient has failed non-surgical conservative treatments for greater than 12 weeks to include NSAID's and/or analgesics and activity modification.  Onset of symptoms was gradual starting 3 years ago with gradually worsening course since that time.The patient noted no past surgery on the right hip(s).  Patient currently rates pain in the right hip at 8 out of 10 with activity. Patient has worsening of pain with activity and weight bearing, pain that interfers with activities of daily living and pain with passive range of motion. Patient has evidence of joint space narrowing by imaging studies. This condition presents safety issues increasing the risk of falls. There is no current active infection.  Patient Active Problem List   Diagnosis Date Noted  . Pain of left hip joint 05/24/2018  . Elevated fasting glucose 05/10/2017  . Osteoarthritis of left hip 05/10/2017  . Overweight (BMI 25.0-29.9) 08/11/2015  . HTN (hypertension) 05/07/2012  . Hyperlipidemia 05/07/2012   Past Medical History:  Diagnosis Date  . Arthritis 08/24/2016   In Left Hip  . HTN (hypertension)   . HTN (hypertension)   . Hyperlipemia   . Schizophrenia [facility])     Past Surgical History:  Procedure Laterality Date  . CYSTOSCOPY  10/30/2019   Procedure: FLEXIBLE CYSTOSCOPY WITH FOLEY INSERTION;  Surgeon: Samson Frederic, MD;  Location: [facility];  Service: Orthopedics;;  . TOTAL HIP ARTHROPLASTY Left 10/30/2019   Procedure: TOTAL HIP ARTHROPLASTY ANTERIOR APPROACH;  Surgeon: Samson Frederic, MD;  Location: [facility];  Service: Orthopedics;  Laterality: Left;    Current Outpatient Medications  Medication Sig Dispense Refill Last Dose  . hydrochlorothiazide (HYDRODIURIL) 12.5 MG  tablet Take 12.5 mg by mouth daily.     Marland Kitchen HYDROcodone-acetaminophen (NORCO) 5-325 MG tablet Take 1 tablet by mouth every 4 (four) hours as needed for moderate pain. 40 tablet 0   . meloxicam (MOBIC) 15 MG tablet Take 1 tablet (15 mg total) by mouth daily. 30 tablet 3   . metoprolol (LOPRESSOR) 100 MG tablet Take 100 mg by mouth 2 (two) times daily.       . Multiple Vitamin (MULTIVITAMIN WITH MINERALS) TABS tablet Take 1 tablet by mouth daily.     . ondansetron (ZOFRAN) 4 MG tablet Take 1 tablet (4 mg total) by mouth every 8 (eight) hours as needed for nausea or vomiting. 20 tablet 0   . quinapril (ACCUPRIL) 40 MG tablet Take 40 mg by mouth at bedtime.       . risperiDONE (RISPERDAL) 1 MG tablet Take one tab in every morning. 90 tablet 0   . risperiDONE (RISPERDAL) 2 MG tablet Take 1 tablet (2 mg total) by mouth at bedtime. 90 tablet 0    No current facility-administered medications for this visit.   No Known Allergies  Social History   Tobacco Use  . Smoking status: Never Smoker  . Smokeless tobacco: Never Used  Substance Use Topics  . Alcohol use: Yes    Alcohol/week: 4.0 standard drinks    Types: 4 Cans of beer per week    No family history on file.   Review of Systems  Musculoskeletal: Positive for arthralgias.  All other systems reviewed and are negative.   Objective:  Physical Exam Constitutional:  Appearance: Normal appearance.  Eyes:     Pupils: Pupils are equal, round, and reactive to light.  Cardiovascular:     Rate and Rhythm: Normal rate and regular rhythm.     Pulses: Normal pulses.  Pulmonary:     Breath sounds: Normal breath sounds.  Abdominal:     Palpations: Abdomen is soft.     Tenderness: There is no abdominal tenderness.  Genitourinary:    Comments: Deferred Musculoskeletal:     Comments: Examination of the left hip reveals no skin wounds or lesions. No trochanteric tenderness to palpation. The right hip is very stiff.He has pain with flexion and  rotation.  Skin:    General: Skin is warm and dry.  Neurological:     Mental Status: He is alert and oriented to person, place, and time.  Psychiatric:        Mood and Affect: Mood normal.     Vital signs in last 24 hours: @VSRANGES @  Labs:   Estimated body mass index is 25.85 kg/m as calculated from the following:   Height as of 10/23/19: 5\' 8"  (1.727 m).   Weight as of 11/25/19: 77.1 kg.   Imaging Review Plain radiographs demonstrate severe degenerative joint disease of the right hip(s). The bone quality appears to be adequate for age and reported activity level.      Assessment/Plan:  End stage arthritis, right hip(s)  The patient history, physical examination, clinical judgement of the provider and imaging studies are consistent with end stage degenerative joint disease of the right hip(s) and total hip arthroplasty is deemed medically necessary. The treatment options including medical management, injection therapy, arthroscopy and arthroplasty were discussed at length. The risks and benefits of total hip arthroplasty were presented and reviewed. The risks due to aseptic loosening, infection, stiffness, dislocation/subluxation,  thromboembolic complications and other imponderables were discussed.  The patient acknowledged the explanation, agreed to proceed with the plan and consent was signed. Patient is being admitted for inpatient treatment for surgery, pain control, PT, OT, prophylactic antibiotics, VTE prophylaxis, progressive ambulation and ADL's and discharge planning.The patient is planning to be discharged home

## 2020-02-04 NOTE — H&P (View-Only) (Signed)
TOTAL HIP ADMISSION H&P  Patient is admitted for right total hip arthroplasty.  Subjective:  Chief Complaint: right hip pain  HPI: Patrick Moreno, 65 y.o. male, has a history of pain and functional disability in the right hip(s) due to arthritis and patient has failed non-surgical conservative treatments for greater than 12 weeks to include NSAID's and/or analgesics and activity modification.  Onset of symptoms was gradual starting 3 years ago with gradually worsening course since that time.The patient noted no past surgery on the right hip(s).  Patient currently rates pain in the right hip at 8 out of 10 with activity. Patient has worsening of pain with activity and weight bearing, pain that interfers with activities of daily living and pain with passive range of motion. Patient has evidence of joint space narrowing by imaging studies. This condition presents safety issues increasing the risk of falls. There is no current active infection.  Patient Active Problem List   Diagnosis Date Noted  . Pain of left hip joint 05/24/2018  . Elevated fasting glucose 05/10/2017  . Osteoarthritis of left hip 05/10/2017  . Overweight (BMI 25.0-29.9) 08/11/2015  . HTN (hypertension) 05/07/2012  . Hyperlipidemia 05/07/2012   Past Medical History:  Diagnosis Date  . Arthritis 08/24/2016   In Left Hip  . HTN (hypertension)   . HTN (hypertension)   . Hyperlipemia   . Schizophrenia [facility])     Past Surgical History:  Procedure Laterality Date  . CYSTOSCOPY  10/30/2019   Procedure: FLEXIBLE CYSTOSCOPY WITH FOLEY INSERTION;  Surgeon: Samson Frederic, MD;  Location: [facility];  Service: Orthopedics;;  . TOTAL HIP ARTHROPLASTY Left 10/30/2019   Procedure: TOTAL HIP ARTHROPLASTY ANTERIOR APPROACH;  Surgeon: Samson Frederic, MD;  Location: [facility];  Service: Orthopedics;  Laterality: Left;    Current Outpatient Medications  Medication Sig Dispense Refill Last Dose  . hydrochlorothiazide (HYDRODIURIL) 12.5 MG  tablet Take 12.5 mg by mouth daily.     Marland Kitchen HYDROcodone-acetaminophen (NORCO) 5-325 MG tablet Take 1 tablet by mouth every 4 (four) hours as needed for moderate pain. 40 tablet 0   . meloxicam (MOBIC) 15 MG tablet Take 1 tablet (15 mg total) by mouth daily. 30 tablet 3   . metoprolol (LOPRESSOR) 100 MG tablet Take 100 mg by mouth 2 (two) times daily.       . Multiple Vitamin (MULTIVITAMIN WITH MINERALS) TABS tablet Take 1 tablet by mouth daily.     . ondansetron (ZOFRAN) 4 MG tablet Take 1 tablet (4 mg total) by mouth every 8 (eight) hours as needed for nausea or vomiting. 20 tablet 0   . quinapril (ACCUPRIL) 40 MG tablet Take 40 mg by mouth at bedtime.       . risperiDONE (RISPERDAL) 1 MG tablet Take one tab in every morning. 90 tablet 0   . risperiDONE (RISPERDAL) 2 MG tablet Take 1 tablet (2 mg total) by mouth at bedtime. 90 tablet 0    No current facility-administered medications for this visit.   No Known Allergies  Social History   Tobacco Use  . Smoking status: Never Smoker  . Smokeless tobacco: Never Used  Substance Use Topics  . Alcohol use: Yes    Alcohol/week: 4.0 standard drinks    Types: 4 Cans of beer per week    No family history on file.   Review of Systems  Musculoskeletal: Positive for arthralgias.  All other systems reviewed and are negative.   Objective:  Physical Exam Constitutional:  Appearance: Normal appearance.  Eyes:     Pupils: Pupils are equal, round, and reactive to light.  Cardiovascular:     Rate and Rhythm: Normal rate and regular rhythm.     Pulses: Normal pulses.  Pulmonary:     Breath sounds: Normal breath sounds.  Abdominal:     Palpations: Abdomen is soft.     Tenderness: There is no abdominal tenderness.  Genitourinary:    Comments: Deferred Musculoskeletal:     Comments: Examination of the left hip reveals no skin wounds or lesions. No trochanteric tenderness to palpation. The right hip is very stiff.He has pain with flexion and  rotation.  Skin:    General: Skin is warm and dry.  Neurological:     Mental Status: He is alert and oriented to person, place, and time.  Psychiatric:        Mood and Affect: Mood normal.     Vital signs in last 24 hours: @VSRANGES@  Labs:   Estimated body mass index is 25.85 kg/m as calculated from the following:   Height as of 10/23/19: 5' 8" (1.727 m).   Weight as of 11/25/19: 77.1 kg.   Imaging Review Plain radiographs demonstrate severe degenerative joint disease of the right hip(s). The bone quality appears to be adequate for age and reported activity level.      Assessment/Plan:  End stage arthritis, right hip(s)  The patient history, physical examination, clinical judgement of the provider and imaging studies are consistent with end stage degenerative joint disease of the right hip(s) and total hip arthroplasty is deemed medically necessary. The treatment options including medical management, injection therapy, arthroscopy and arthroplasty were discussed at length. The risks and benefits of total hip arthroplasty were presented and reviewed. The risks due to aseptic loosening, infection, stiffness, dislocation/subluxation,  thromboembolic complications and other imponderables were discussed.  The patient acknowledged the explanation, agreed to proceed with the plan and consent was signed. Patient is being admitted for inpatient treatment for surgery, pain control, PT, OT, prophylactic antibiotics, VTE prophylaxis, progressive ambulation and ADL's and discharge planning.The patient is planning to be discharged home   

## 2020-02-10 ENCOUNTER — Telehealth (INDEPENDENT_AMBULATORY_CARE_PROVIDER_SITE_OTHER): Payer: Medicare Other | Admitting: Psychiatry

## 2020-02-10 ENCOUNTER — Encounter (HOSPITAL_COMMUNITY): Payer: Self-pay | Admitting: Psychiatry

## 2020-02-10 ENCOUNTER — Other Ambulatory Visit: Payer: Self-pay

## 2020-02-10 VITALS — Wt 168.0 lb

## 2020-02-10 DIAGNOSIS — F200 Paranoid schizophrenia: Secondary | ICD-10-CM

## 2020-02-10 MED ORDER — RISPERIDONE 1 MG PO TABS: ORAL_TABLET | ORAL | 0 refills | Status: DC

## 2020-02-10 MED ORDER — RISPERIDONE 2 MG PO TABS: 2.0000 mg | ORAL_TABLET | Freq: Every day | ORAL | 0 refills | Status: DC

## 2020-02-10 NOTE — Progress Notes (Signed)
Virtual Visit via Telephone Note  I connected with Patrick Moreno on 02/10/20 at  9:20 AM EST by telephone and verified that I am speaking with the correct person using two identifiers.  Location: Patient: Home Provider: Home Office   I discussed the limitations, risks, security and privacy concerns of performing an evaluation and management service by telephone and the availability of in person appointments. I also discussed with the patient that there may be a patient responsible charge related to this service. The patient expressed understanding and agreed to proceed.   History of Present Illness: Patient is evaluated by phone session.  He is on the phone by himself.  On the last visit we cut down his Risperdal from 2 mg twice a day to take 1 mg in the morning and 2 mg at bedtime.  He has been doing fine and denies any paranoia, hallucination, irritability.  He is sleeping good.  He had lost 3 pounds since the last visit.  His thinking is clear.  He is having right hip surgery in a week.  He is looking forward to because he has difficulty walking.  He had left hip surgery earlier and he is recovered from that.  He had a good support from his family member.  He had a good Christmas and was able to see his nephew and niece.  He has no tremors, shakes or any EPS.  He lives by himself.  He denies any agitation, mood swings, crying spells or any suicidal thoughts.   Past Psychiatric History: H/Oschizophrenia since 85. H/Opsychotic episode whileworking in [place]. No h/o inpatient orsuicidalattempt. H/OtakingStelazine.   Psychiatric Specialty Exam: Physical Exam  Review of Systems  Weight 168 lb (76.2 kg).Body mass index is 25.54 kg/m.  General Appearance: NA  Eye Contact:  NA  Speech:  Slow  Volume:  Normal  Mood:  Euthymic  Affect:  NA  Thought Process:  Goal Directed  Orientation:  Full (Time, Place, and Person)  Thought Content:  WDL  Suicidal Thoughts:  No   Homicidal Thoughts:  No  Memory:  Immediate;   Good Recent;   Good Remote;   Good  Judgement:  Intact  Insight:  Good  Psychomotor Activity:  NA  Concentration:  Concentration: Fair and Attention Span: Fair  Recall:  Good  Fund of Knowledge:  Good  Language:  Good  Akathisia:  No  Handed:  Right  AIMS (if indicated):     Assets:  Communication Skills Desire for Improvement Housing Resilience Social Support  ADL's:  Intact  Cognition:  WNL  Sleep:   ok      Assessment and Plan: Schizophrenia chronic paranoid type.  Patient doing well and tolerating well his current medication.  He is now taking total dose of Risperdal 3 mg.  I recommend he can try cutting down his 1 mg in the morning however if noticed that his symptoms coming back that he can go back to take 1 mg in the morning.  He will continue 2 mg at bedtime.  Patient agreed to give a try.  He is looking forward to having right hip surgery in few weeks.  Reassurance given.  He had a support from his family member.  Recommended to call us back with any question or any concern.  Follow-up in 3 months.  Follow Up Instructions:    I discussed the assessment and treatment plan with the patient. The patient was provided an opportunity to ask questions and all were answered. The  patient agreed with the plan and demonstrated an understanding of the instructions.   The patient was advised to call back or seek an in-person evaluation if the symptoms worsen or if the condition fails to improve as anticipated.  I provided 15 minutes of non-face-to-face time during this encounter.   Cleotis Nipper, MD

## 2020-02-11 NOTE — Patient Instructions (Addendum)
DUE TO COVID-19 ONLY ONE VISITOR IS ALLOWED TO COME WITH YOU AND STAY IN THE WAITING ROOM ONLY DURING PRE OP AND PROCEDURE DAY OF SURGERY. THE 1 VISITOR  MAY VISIT WITH YOU AFTER SURGERY IN YOUR PRIVATE ROOM DURING VISITING HOURS ONLY!  YOU NEED TO HAVE A COVID 19 TEST ON: 02/15/20 @ 9:00 AM , THIS TEST MUST BE DONE BEFORE SURGERY,  COVID TESTING SITE [address] [place] [address], IT IS ON THE RIGHT GOING OUT [address] APPROXIMATELY  2 MINUTES PAST [employer] ON THE RIGHT. ONCE YOUR COVID TEST IS COMPLETED,  PLEASE BEGIN THE QUARANTINE INSTRUCTIONS AS OUTLINED IN YOUR HANDOUT.                Patrick Moreno   Your procedure is scheduled on: 02/19/20   Report to [facility] Main  Entrance   Report to admitting at: 6:00 AM     Call this number if you have problems the morning of surgery [phone]    Remember: NO SOLID FOOD AFTER MIDNIGHT THE NIGHT PRIOR TO SURGERY. NOTHING BY MOUTH EXCEPT CLEAR LIQUIDS UNTIL: 5:30 AM . PLEASE FINISH ENSURE DRINK PER SURGEON ORDER  WHICH NEEDS TO BE COMPLETED AT: 5:30 AM .   CLEAR LIQUID DIET   Foods Allowed                                                                     Foods Excluded  Coffee and tea, regular and decaf                             liquids that you cannot  Plain Jell-O any favor except red or purple                                           see through such as: Fruit ices (not with fruit pulp)                                     milk, soups, orange juice  Iced Popsicles                                    All solid food Carbonated beverages, regular and diet                                    Cranberry, grape and apple juices Sports drinks like Gatorade Lightly seasoned clear broth or consume(fat free) Sugar, honey syrup  Sample Menu Breakfast                                Lunch  Supper Cranberry juice                    Beef broth                             Chicken broth Jell-O                                     Grape juice                           Apple juice Coffee or tea                        Jell-O                                      Popsicle                                                Coffee or tea                        Coffee or tea  _____________________________________________________________________   BRUSH YOUR TEETH MORNING OF SURGERY AND RINSE YOUR MOUTH OUT, NO CHEWING GUM CANDY OR MINTS.   Take these medicines the morning of surgery with A SIP OF WATER: risperidone                               You may not have any metal on your body including hair pins and              piercings  Do not wear jewelry, lotions, powders or perfumes, deodorant             Men may shave face and neck.   Do not bring valuables to the hospital. [name] IS NOT             RESPONSIBLE   FOR VALUABLES.  Contacts, dentures or bridgework may not be worn into surgery.  Leave suitcase in the car. After surgery it may be brought to your room.     Patients discharged the day of surgery will not be allowed to drive home. IF YOU ARE HAVING SURGERY AND GOING HOME THE SAME DAY, YOU MUST HAVE AN ADULT TO DRIVE YOU HOME AND BE WITH YOU FOR 24 HOURS. YOU MAY GO HOME BY TAXI OR UBER OR ORTHERWISE, BUT AN ADULT MUST ACCOMPANY YOU HOME AND STAY WITH YOU FOR 24 HOURS.  Name and phone number of your driver:  Special Instructions: N/A              Please read over the following fact sheets you were given: _____________________________________________________________________         [facility] - Preparing for Surgery Before surgery, you can play an important role.  Because skin is not sterile, your skin needs to be as free of germs as possible.  You can reduce the number of germs on your skin by washing with CHG (chlorahexidine gluconate) soap  before surgery.  CHG is an antiseptic cleaner which kills germs and bonds with the skin to continue killing germs  even after washing. Please DO NOT use if you have an allergy to CHG or antibacterial soaps.  If your skin becomes reddened/irritated stop using the CHG and inform your nurse when you arrive at Short Stay. Do not shave (including legs and underarms) for at least 48 hours prior to the first CHG shower.  You may shave your face/neck. Please follow these instructions carefully:  1.  Shower with CHG Soap the night before surgery and the  morning of Surgery.  2.  If you choose to wash your hair, wash your hair first as usual with your  normal  shampoo.  3.  After you shampoo, rinse your hair and body thoroughly to remove the  shampoo.                           4.  Use CHG as you would any other liquid soap.  You can apply chg directly  to the skin and wash                       Gently with a scrungie or clean washcloth.  5.  Apply the CHG Soap to your body ONLY FROM THE NECK DOWN.   Do not use on face/ open                           Wound or open sores. Avoid contact with eyes, ears mouth and genitals (private parts).                       Wash face,  Genitals (private parts) with your normal soap.             6.  Wash thoroughly, paying special attention to the area where your surgery  will be performed.  7.  Thoroughly rinse your body with warm water from the neck down.  8.  DO NOT shower/wash with your normal soap after using and rinsing off  the CHG Soap.                9.  Pat yourself dry with a clean towel.            10.  Wear clean pajamas.            11.  Place clean sheets on your bed the night of your first shower and do not  sleep with pets. Day of Surgery : Do not apply any lotions/deodorants the morning of surgery.  Please wear clean clothes to the hospital/surgery center.  FAILURE TO FOLLOW THESE INSTRUCTIONS MAY RESULT IN THE CANCELLATION OF YOUR SURGERY PATIENT SIGNATURE_________________________________  NURSE  SIGNATURE__________________________________  ________________________________________________________________________   Patrick Moreno  An incentive spirometer is a tool that can help keep your lungs clear and active. This tool measures how well you are filling your lungs with each breath. Taking long deep breaths may help reverse or decrease the chance of developing breathing (pulmonary) problems (especially infection) following:  A long period of time when you are unable to move or be active. BEFORE THE PROCEDURE   If the spirometer includes an indicator to show your best effort, your nurse or respiratory therapist will set it to a desired goal.  If possible, sit up straight or lean slightly forward. Try  not to slouch.  Hold the incentive spirometer in an upright position. INSTRUCTIONS FOR USE  1. Sit on the edge of your bed if possible, or sit up as far as you can in bed or on a chair. 2. Hold the incentive spirometer in an upright position. 3. Breathe out normally. 4. Place the mouthpiece in your mouth and seal your lips tightly around it. 5. Breathe in slowly and as deeply as possible, raising the piston or the ball toward the top of the column. 6. Hold your breath for 3-5 seconds or for as long as possible. Allow the piston or ball to fall to the bottom of the column. 7. Remove the mouthpiece from your mouth and breathe out normally. 8. Rest for a few seconds and repeat Steps 1 through 7 at least 10 times every 1-2 hours when you are awake. Take your time and take a few normal breaths between deep breaths. 9. The spirometer may include an indicator to show your best effort. Use the indicator as a goal to work toward during each repetition. 10. After each set of 10 deep breaths, practice coughing to be sure your lungs are clear. If you have an incision (the cut made at the time of surgery), support your incision when coughing by placing a pillow or rolled up towels firmly  against it. Once you are able to get out of bed, walk around indoors and cough well. You may stop using the incentive spirometer when instructed by your caregiver.  RISKS AND COMPLICATIONS  Take your time so you do not get dizzy or light-headed.  If you are in pain, you may need to take or ask for pain medication before doing incentive spirometry. It is harder to take a deep breath if you are having pain. AFTER USE  Rest and breathe slowly and easily.  It can be helpful to keep track of a log of your progress. Your caregiver can provide you with a simple table to help with this. If you are using the spirometer at home, follow these instructions: [place] IF:   You are having difficultly using the spirometer.  You have trouble using the spirometer as often as instructed.  Your pain medication is not giving enough relief while using the spirometer.  You develop fever of 100.5 F (38.1 C) or higher. SEEK IMMEDIATE MEDICAL CARE IF:   You cough up bloody sputum that had not been present before.  You develop fever of 102 F (38.9 C) or greater.  You develop worsening pain at or near the incision site. MAKE SURE YOU:   Understand these instructions.  Will watch your condition.  Will get help right away if you are not doing well or get worse. Document Released: 05/30/2006 Document Revised: 04/11/2011 Document Reviewed: 07/31/2006 [name] Patient Information 2014 [place], [place].   ________________________________________________________________________

## 2020-02-12 ENCOUNTER — Other Ambulatory Visit: Payer: Self-pay

## 2020-02-12 ENCOUNTER — Encounter (HOSPITAL_COMMUNITY)
Admission: RE | Admit: 2020-02-12 | Discharge: 2020-02-12 | Disposition: A | Discharge status: Home / Self Care | Payer: Medicare Other | Source: Ambulatory Visit | Attending: Orthopedic Surgery | Admitting: Orthopedic Surgery

## 2020-02-12 ENCOUNTER — Encounter (HOSPITAL_COMMUNITY): Payer: Self-pay

## 2020-02-12 DIAGNOSIS — Z01812 Encounter for preprocedural laboratory examination: Secondary | ICD-10-CM | POA: Diagnosis not present

## 2020-02-12 LAB — COMPREHENSIVE METABOLIC PANEL
ALT: 20 U/L (ref 0–44)
AST: 16 U/L (ref 15–41)
Albumin: 4.3 g/dL (ref 3.5–5.0)
Alkaline Phosphatase: 79 U/L (ref 38–126)
Anion gap: 10 (ref 5–15)
BUN: 30 mg/dL — ABNORMAL HIGH (ref 8–23)
CO2: 26 mmol/L (ref 22–32)
Calcium: 9.4 mg/dL (ref 8.9–10.3)
Chloride: 102 mmol/L (ref 98–111)
Creatinine, Ser: 0.9 mg/dL (ref 0.61–1.24)
GFR, Estimated: >60 mL/min (ref >60)
Glucose, Bld: 117 mg/dL — ABNORMAL HIGH (ref 70–99)
Potassium: 3.4 mmol/L — ABNORMAL LOW (ref 3.5–5.1)
Sodium: 138 mmol/L (ref 135–145)
Total Bilirubin: 0.5 mg/dL (ref 0.3–1.2)
Total Protein: 6.6 g/dL (ref 6.5–8.1)

## 2020-02-12 LAB — URINALYSIS, ROUTINE W REFLEX MICROSCOPIC
Bilirubin Urine: NEGATIVE
Glucose, UA: NEGATIVE mg/dL
Ketones, ur: NEGATIVE mg/dL
Leukocytes,Ua: NEGATIVE
Nitrite: NEGATIVE
Protein, ur: NEGATIVE mg/dL
Specific Gravity, Urine: 1.021 (ref 1.005–1.030)
pH: 5 (ref 5.0–8.0)

## 2020-02-12 LAB — CBC
HCT: 40.9 % (ref 39.0–52.0)
Hemoglobin: 13.6 g/dL (ref 13.0–17.0)
MCH: 29.8 pg (ref 26.0–34.0)
MCHC: 33.3 g/dL (ref 30.0–36.0)
MCV: 89.5 fL (ref 80.0–100.0)
Platelets: 202 10*3/uL (ref 150–400)
RBC: 4.57 MIL/uL (ref 4.22–5.81)
RDW: 13.3 % (ref 11.5–15.5)
WBC: 5.9 10*3/uL (ref 4.0–10.5)
nRBC: 0 % (ref 0.0–0.2)

## 2020-02-12 LAB — SURGICAL PCR SCREEN
MRSA, PCR: NEGATIVE
Staphylococcus aureus: NEGATIVE

## 2020-02-12 LAB — PROTIME-INR
INR: 1 (ref 0.8–1.2)
Prothrombin Time: 12.3 seconds (ref 11.4–15.2)

## 2020-02-12 NOTE — Progress Notes (Signed)
Lab. Results: Hgb urine dipstick: moderate, Bacteria UA: rare.

## 2020-02-12 NOTE — Progress Notes (Signed)
COVID Vaccine Completed: Yes Date COVID Vaccine completed: 11/30/19 [place] COVID vaccine manufacturer: Pfizer      PCP - [facility]. LOV: 12/11/19  Cardiologist -   Chest x-ray -  EKG - 08/21/19. requested Stress Test -  ECHO -  Cardiac Cath -  Pacemaker/ICD device last checked:  Sleep Study -  CPAP -   Fasting Blood Sugar -  Checks Blood Sugar _____ times a day  Blood Thinner Instructions: Aspirin Instructions: Last Dose:  Anesthesia review: Hx: HTN  Patient denies shortness of breath, fever, cough and chest pain at PAT appointment   Patient verbalized understanding of instructions that were given to them at the PAT appointment. Patient was also instructed that they will need to review over the PAT instructions again at home before surgery.

## 2020-02-15 ENCOUNTER — Other Ambulatory Visit (HOSPITAL_COMMUNITY)
Admission: RE | Admit: 2020-02-15 | Discharge: 2020-02-15 | Disposition: A | Discharge status: Home / Self Care | Payer: Medicare Other | Source: Ambulatory Visit | Attending: Orthopedic Surgery | Admitting: Orthopedic Surgery

## 2020-02-15 DIAGNOSIS — Z20822 Contact with and (suspected) exposure to covid-19: Secondary | ICD-10-CM | POA: Insufficient documentation

## 2020-02-15 DIAGNOSIS — Z01812 Encounter for preprocedural laboratory examination: Secondary | ICD-10-CM | POA: Insufficient documentation

## 2020-02-15 LAB — SARS CORONAVIRUS 2 (TAT 6-24 HRS): SARS Coronavirus 2: NEGATIVE

## 2020-02-18 NOTE — Anesthesia Preprocedure Evaluation (Signed)
Anesthesia Evaluation  Patient identified by MRN, date of birth, ID band Patient awake    Reviewed: Allergy & Precautions, NPO status , Patient's Chart, lab work & pertinent test results, reviewed documented beta blocker date and time   History of Anesthesia Complications Negative for: history of anesthetic complications  Airway Mallampati: II  TM Distance: >3 FB Neck ROM: Full    Dental no notable dental hx.    Pulmonary neg pulmonary ROS,    Pulmonary exam normal        Cardiovascular hypertension, Pt. on medications and Pt. on home beta blockers Normal cardiovascular exam     Neuro/Psych Schizophrenia negative neurological ROS     GI/Hepatic negative GI ROS, Neg liver ROS,   Endo/Other  negative endocrine ROS  Renal/GU negative Renal ROS  negative genitourinary   Musculoskeletal  (+) Arthritis ,   Abdominal   Peds  Hematology negative hematology ROS (+)   Anesthesia Other Findings Day of surgery medications reviewed with patient.  Reproductive/Obstetrics negative OB ROS                            Anesthesia Physical Anesthesia Plan  ASA: II  Anesthesia Plan: Spinal   Post-op Pain Management:    Induction:   PONV Risk Score and Plan: 2 and Treatment may vary due to age or medical condition, Ondansetron, Propofol infusion, Dexamethasone and Midazolam  Airway Management Planned: Natural Airway and Simple Face Mask  Additional Equipment: None  Intra-op Plan:   Post-operative Plan:   Informed Consent: I have reviewed the patients History and Physical, chart, labs and discussed the procedure including the risks, benefits and alternatives for the proposed anesthesia with the patient or authorized representative who has indicated his/her understanding and acceptance.       Plan Discussed with: CRNA  Anesthesia Plan Comments:        Anesthesia Quick Evaluation

## 2020-02-19 ENCOUNTER — Ambulatory Visit (HOSPITAL_COMMUNITY)
Admission: RE | Admit: 2020-02-19 | Discharge: 2020-02-19 | Disposition: A | Discharge status: Home / Self Care | Payer: Medicare Other | Attending: Orthopedic Surgery | Admitting: Orthopedic Surgery

## 2020-02-19 ENCOUNTER — Encounter (HOSPITAL_COMMUNITY): Payer: Self-pay | Admitting: Orthopedic Surgery

## 2020-02-19 ENCOUNTER — Ambulatory Visit (HOSPITAL_COMMUNITY): Payer: Medicare Other

## 2020-02-19 ENCOUNTER — Ambulatory Visit (HOSPITAL_COMMUNITY): Payer: Medicare Other | Admitting: Physician Assistant

## 2020-02-19 ENCOUNTER — Other Ambulatory Visit: Payer: Self-pay

## 2020-02-19 ENCOUNTER — Encounter (HOSPITAL_COMMUNITY)
Admission: RE | Disposition: A | Discharge status: Home / Self Care | Payer: Self-pay | Source: Home / Self Care | Attending: Orthopedic Surgery

## 2020-02-19 ENCOUNTER — Ambulatory Visit (HOSPITAL_COMMUNITY): Payer: Medicare Other | Admitting: Anesthesiology

## 2020-02-19 DIAGNOSIS — Z791 Long term (current) use of non-steroidal anti-inflammatories (NSAID): Secondary | ICD-10-CM | POA: Diagnosis not present

## 2020-02-19 DIAGNOSIS — F209 Schizophrenia, unspecified: Secondary | ICD-10-CM | POA: Diagnosis not present

## 2020-02-19 DIAGNOSIS — Z419 Encounter for procedure for purposes other than remedying health state, unspecified: Secondary | ICD-10-CM

## 2020-02-19 DIAGNOSIS — Z09 Encounter for follow-up examination after completed treatment for conditions other than malignant neoplasm: Secondary | ICD-10-CM

## 2020-02-19 DIAGNOSIS — Z471 Aftercare following joint replacement surgery: Secondary | ICD-10-CM | POA: Diagnosis not present

## 2020-02-19 DIAGNOSIS — M1611 Unilateral primary osteoarthritis, right hip: Secondary | ICD-10-CM | POA: Diagnosis not present

## 2020-02-19 DIAGNOSIS — M85851 Other specified disorders of bone density and structure, right thigh: Secondary | ICD-10-CM | POA: Insufficient documentation

## 2020-02-19 DIAGNOSIS — Z96642 Presence of left artificial hip joint: Secondary | ICD-10-CM | POA: Diagnosis not present

## 2020-02-19 DIAGNOSIS — Z79899 Other long term (current) drug therapy: Secondary | ICD-10-CM | POA: Insufficient documentation

## 2020-02-19 DIAGNOSIS — E785 Hyperlipidemia, unspecified: Secondary | ICD-10-CM | POA: Diagnosis not present

## 2020-02-19 DIAGNOSIS — Z96641 Presence of right artificial hip joint: Secondary | ICD-10-CM | POA: Diagnosis not present

## 2020-02-19 DIAGNOSIS — I10 Essential (primary) hypertension: Secondary | ICD-10-CM | POA: Diagnosis not present

## 2020-02-19 HISTORY — PX: TOTAL HIP ARTHROPLASTY: SHX124

## 2020-02-19 LAB — TYPE AND SCREEN
ABO/RH(D): O POS
Antibody Screen: NEGATIVE

## 2020-02-19 SURGERY — ARTHROPLASTY, HIP, TOTAL, ANTERIOR APPROACH
Anesthesia: Spinal | Site: Hip | Laterality: Right

## 2020-02-19 MED ORDER — HYDROCODONE-ACETAMINOPHEN 5-325 MG PO TABS: 1.0000 | ORAL_TABLET | ORAL | 0 refills | Status: DC | PRN

## 2020-02-19 MED ORDER — OXYCODONE HCL 5 MG PO TABS
5.0000 mg | ORAL_TABLET | Freq: Once | ORAL | Status: AC | PRN
Start: 2020-02-19 — End: 2020-02-19
  Administered 2020-02-19: 5 mg via ORAL

## 2020-02-19 MED ORDER — HYDROCODONE-ACETAMINOPHEN 5-325 MG PO TABS: 1.0000 | ORAL_TABLET | ORAL | Status: DC | PRN

## 2020-02-19 MED ORDER — KETOROLAC TROMETHAMINE 15 MG/ML IJ SOLN: 7.5000 mg | Freq: Four times a day (QID) | INTRAMUSCULAR | Status: DC

## 2020-02-19 MED ORDER — PROPOFOL 1000 MG/100ML IV EMUL
INTRAVENOUS | Status: AC
  Filled 2020-02-19: qty 100

## 2020-02-19 MED ORDER — ACETAMINOPHEN 500 MG PO TABS: 1000.0000 mg | ORAL_TABLET | Freq: Once | ORAL | Status: DC

## 2020-02-19 MED ORDER — PROMETHAZINE HCL 25 MG/ML IJ SOLN
6.2500 mg | INTRAMUSCULAR | Status: DC | PRN
Start: 2020-02-19 — End: 2020-02-19

## 2020-02-19 MED ORDER — BUPIVACAINE-EPINEPHRINE 0.25% -1:200000 IJ SOLN
INTRAMUSCULAR | Status: DC | PRN
  Administered 2020-02-19: 30 mL

## 2020-02-19 MED ORDER — BUPIVACAINE-EPINEPHRINE (PF) 0.25% -1:200000 IJ SOLN
INTRAMUSCULAR | Status: AC
  Filled 2020-02-19: qty 30

## 2020-02-19 MED ORDER — PHENYLEPHRINE 40 MCG/ML (10ML) SYRINGE FOR IV PUSH (FOR BLOOD PRESSURE SUPPORT)
PREFILLED_SYRINGE | INTRAVENOUS | Status: DC | PRN
  Administered 2020-02-19 (×2): 80 ug via INTRAVENOUS

## 2020-02-19 MED ORDER — SODIUM CHLORIDE 0.9 % IR SOLN
Status: DC | PRN
  Administered 2020-02-19: 1000 mL

## 2020-02-19 MED ORDER — DEXAMETHASONE SODIUM PHOSPHATE 10 MG/ML IJ SOLN
INTRAMUSCULAR | Status: DC | PRN
  Administered 2020-02-19: 10 mg via INTRAVENOUS

## 2020-02-19 MED ORDER — CEFAZOLIN SODIUM-DEXTROSE 2-4 GM/100ML-% IV SOLN: 2.0000 g | Freq: Four times a day (QID) | INTRAVENOUS | Status: DC

## 2020-02-19 MED ORDER — POVIDONE-IODINE 10 % EX SWAB: 2.0000 "application " | Freq: Once | CUTANEOUS | Status: DC

## 2020-02-19 MED ORDER — PHENYLEPHRINE HCL (PRESSORS) 10 MG/ML IV SOLN
INTRAVENOUS | Status: AC
  Filled 2020-02-19: qty 1

## 2020-02-19 MED ORDER — LACTATED RINGERS IV SOLN: INTRAVENOUS | Status: DC

## 2020-02-19 MED ORDER — METHOCARBAMOL 500 MG PO TABS
500.0000 mg | ORAL_TABLET | Freq: Four times a day (QID) | ORAL | Status: DC | PRN
  Administered 2020-02-19: 500 mg via ORAL

## 2020-02-19 MED ORDER — ASPIRIN 81 MG PO CHEW: 81.0000 mg | CHEWABLE_TABLET | Freq: Two times a day (BID) | ORAL | 0 refills | Status: AC

## 2020-02-19 MED ORDER — ONDANSETRON HCL 4 MG PO TABS: 4.0000 mg | ORAL_TABLET | Freq: Three times a day (TID) | ORAL | 0 refills | Status: DC | PRN

## 2020-02-19 MED ORDER — SODIUM CHLORIDE 0.9 % IV SOLN: INTRAVENOUS | Status: DC

## 2020-02-19 MED ORDER — METHOCARBAMOL 500 MG IVPB - SIMPLE MED: 500.0000 mg | Freq: Four times a day (QID) | INTRAVENOUS | Status: DC | PRN

## 2020-02-19 MED ORDER — CHLORHEXIDINE GLUCONATE 0.12 % MT SOLN
15.0000 mL | Freq: Once | OROMUCOSAL | Status: AC
  Administered 2020-02-19: 15 mL via OROMUCOSAL

## 2020-02-19 MED ORDER — HYDROCODONE-ACETAMINOPHEN 7.5-325 MG PO TABS: 1.0000 | ORAL_TABLET | ORAL | Status: DC | PRN

## 2020-02-19 MED ORDER — WATER FOR IRRIGATION, STERILE IR SOLN
Status: DC | PRN
  Administered 2020-02-19: 2000 mL

## 2020-02-19 MED ORDER — METHOCARBAMOL 500 MG PO TABS
ORAL_TABLET | ORAL | Status: AC
  Filled 2020-02-19: qty 1

## 2020-02-19 MED ORDER — PROPOFOL 500 MG/50ML IV EMUL
INTRAVENOUS | Status: DC | PRN
  Administered 2020-02-19: 50 ug/kg/min via INTRAVENOUS

## 2020-02-19 MED ORDER — TRANEXAMIC ACID-NACL 1000-0.7 MG/100ML-% IV SOLN
1000.0000 mg | INTRAVENOUS | Status: AC
  Administered 2020-02-19: 1000 mg via INTRAVENOUS
  Filled 2020-02-19: qty 100

## 2020-02-19 MED ORDER — OXYCODONE HCL 5 MG/5ML PO SOLN: 5.0000 mg | Freq: Once | ORAL | Status: AC | PRN

## 2020-02-19 MED ORDER — ACETAMINOPHEN 10 MG/ML IV SOLN
1000.0000 mg | Freq: Once | INTRAVENOUS | Status: AC | PRN
  Administered 2020-02-19: 1000 mg via INTRAVENOUS
  Filled 2020-02-19: qty 100

## 2020-02-19 MED ORDER — LIDOCAINE 2% (20 MG/ML) 5 ML SYRINGE
INTRAMUSCULAR | Status: DC | PRN
  Administered 2020-02-19: 40 mg via INTRAVENOUS

## 2020-02-19 MED ORDER — SENNA 8.6 MG PO TABS: 2.0000 | ORAL_TABLET | Freq: Every day | ORAL | 1 refills | Status: AC

## 2020-02-19 MED ORDER — ALBUMIN HUMAN 5 % IV SOLN: INTRAVENOUS | Status: DC | PRN

## 2020-02-19 MED ORDER — PHENYLEPHRINE 40 MCG/ML (10ML) SYRINGE FOR IV PUSH (FOR BLOOD PRESSURE SUPPORT)
PREFILLED_SYRINGE | INTRAVENOUS | Status: AC
  Filled 2020-02-19: qty 10

## 2020-02-19 MED ORDER — KETOROLAC TROMETHAMINE 30 MG/ML IJ SOLN
INTRAMUSCULAR | Status: AC
  Filled 2020-02-19: qty 1

## 2020-02-19 MED ORDER — LACTATED RINGERS IV BOLUS: 250.0000 mL | Freq: Once | INTRAVENOUS | Status: DC

## 2020-02-19 MED ORDER — 0.9 % SODIUM CHLORIDE (POUR BTL) OPTIME
TOPICAL | Status: DC | PRN
  Administered 2020-02-19: 1000 mL

## 2020-02-19 MED ORDER — ISOPROPYL ALCOHOL 70 % SOLN
Status: DC | PRN
  Administered 2020-02-19: 1 via TOPICAL

## 2020-02-19 MED ORDER — DOCUSATE SODIUM 100 MG PO CAPS: 100.0000 mg | ORAL_CAPSULE | Freq: Two times a day (BID) | ORAL | 1 refills | Status: AC

## 2020-02-19 MED ORDER — BUPIVACAINE IN DEXTROSE 0.75-8.25 % IT SOLN
INTRATHECAL | Status: DC | PRN
  Administered 2020-02-19: 1.8 mL via INTRATHECAL

## 2020-02-19 MED ORDER — OXYCODONE HCL 5 MG PO TABS
ORAL_TABLET | ORAL | Status: AC
  Filled 2020-02-19: qty 1

## 2020-02-19 MED ORDER — CEFAZOLIN SODIUM-DEXTROSE 2-4 GM/100ML-% IV SOLN
2.0000 g | INTRAVENOUS | Status: AC
  Administered 2020-02-19: 2 g via INTRAVENOUS
  Filled 2020-02-19: qty 100

## 2020-02-19 MED ORDER — SODIUM CHLORIDE 0.9 % IR SOLN
Status: DC | PRN
Start: 2020-02-19 — End: 2020-02-19
  Administered 2020-02-19: 3000 mL

## 2020-02-19 MED ORDER — SODIUM CHLORIDE (PF) 0.9 % IJ SOLN
INTRAMUSCULAR | Status: DC | PRN
  Administered 2020-02-19: 30 mL

## 2020-02-19 MED ORDER — ORAL CARE MOUTH RINSE: 15.0000 mL | Freq: Once | OROMUCOSAL | Status: AC

## 2020-02-19 MED ORDER — ONDANSETRON HCL 4 MG/2ML IJ SOLN
INTRAMUSCULAR | Status: AC
  Filled 2020-02-19: qty 2

## 2020-02-19 MED ORDER — PHENYLEPHRINE HCL-NACL 10-0.9 MG/250ML-% IV SOLN
INTRAVENOUS | Status: DC | PRN
  Administered 2020-02-19: 30 ug/min via INTRAVENOUS

## 2020-02-19 MED ORDER — ONDANSETRON HCL 4 MG/2ML IJ SOLN
INTRAMUSCULAR | Status: DC | PRN
  Administered 2020-02-19: 4 mg via INTRAVENOUS

## 2020-02-19 MED ORDER — POVIDONE-IODINE 10 % EX SWAB
2.0000 "application " | Freq: Once | CUTANEOUS | Status: AC
  Administered 2020-02-19: 2 via TOPICAL

## 2020-02-19 MED ORDER — PROPOFOL 10 MG/ML IV BOLUS
INTRAVENOUS | Status: AC
  Filled 2020-02-19: qty 20

## 2020-02-19 MED ORDER — DEXAMETHASONE SODIUM PHOSPHATE 10 MG/ML IJ SOLN
INTRAMUSCULAR | Status: AC
  Filled 2020-02-19: qty 1

## 2020-02-19 MED ORDER — LACTATED RINGERS IV BOLUS
500.0000 mL | Freq: Once | INTRAVENOUS | Status: AC
  Administered 2020-02-19: 500 mL via INTRAVENOUS

## 2020-02-19 MED ORDER — KETOROLAC TROMETHAMINE 30 MG/ML IJ SOLN
INTRAMUSCULAR | Status: DC | PRN
  Administered 2020-02-19: 30 mg

## 2020-02-19 MED ORDER — SODIUM CHLORIDE (PF) 0.9 % IJ SOLN
INTRAMUSCULAR | Status: AC
  Filled 2020-02-19: qty 30

## 2020-02-19 MED ORDER — FENTANYL CITRATE (PF) 100 MCG/2ML IJ SOLN: 25.0000 ug | INTRAMUSCULAR | Status: DC | PRN

## 2020-02-19 SURGICAL SUPPLY — 67 items
ADH SKN CLS APL DERMABOND .7 (GAUZE/BANDAGES/DRESSINGS) ×1
APL PRP STRL LF DISP 70% ISPRP (MISCELLANEOUS) ×1
BAG DECANTER FOR FLEXI CONT (MISCELLANEOUS) IMPLANT
BAG SPEC THK2 15X12 ZIP CLS (MISCELLANEOUS)
BAG ZIPLOCK 12X15 (MISCELLANEOUS) IMPLANT
BLADE SURG SZ10 CARB STEEL (BLADE) IMPLANT
CHLORAPREP W/TINT 26 (MISCELLANEOUS) ×2 IMPLANT
CLEANER TIP ELECTROSURG 2X2 (MISCELLANEOUS) ×1 IMPLANT
COVER PERINEAL POST (MISCELLANEOUS) ×2 IMPLANT
COVER SURGICAL LIGHT HANDLE (MISCELLANEOUS) ×2 IMPLANT
COVER WAND RF STERILE (DRAPES) IMPLANT
CUP ACETAB 56MM (Orthopedic Implant) ×1 IMPLANT
DECANTER SPIKE VIAL GLASS SM (MISCELLANEOUS) ×2 IMPLANT
DERMABOND ADVANCED (GAUZE/BANDAGES/DRESSINGS) ×1
DERMABOND ADVANCED .7 DNX12 (GAUZE/BANDAGES/DRESSINGS) ×2 IMPLANT
DRAPE IMP U-DRAPE 54X76 (DRAPES) ×2 IMPLANT
DRAPE SHEET LG 3/4 BI-LAMINATE (DRAPES) ×6 IMPLANT
DRAPE STERI IOBAN 125X83 (DRAPES) IMPLANT
DRAPE U-SHAPE 47X51 STRL (DRAPES) ×4 IMPLANT
DRSG AQUACEL AG ADV 3.5X10 (GAUZE/BANDAGES/DRESSINGS) ×2 IMPLANT
ELECT REM PT RETURN 15FT ADLT (MISCELLANEOUS) ×2 IMPLANT
GAUZE SPONGE 4X4 12PLY STRL (GAUZE/BANDAGES/DRESSINGS) ×2 IMPLANT
GLOVE BIO SURGEON STRL SZ8.5 (GLOVE) ×4 IMPLANT
GLOVE BIOGEL PI IND STRL 8.5 (GLOVE) ×1 IMPLANT
GLOVE BIOGEL PI INDICATOR 8.5 (GLOVE) ×1
GLOVE SRG 8 PF TXTR STRL LF DI (GLOVE) ×1 IMPLANT
GLOVE SURG ENC TEXT LTX SZ7.5 (GLOVE) ×4 IMPLANT
GLOVE SURG UNDER POLY LF SZ8 (GLOVE) ×2
GOWN SPEC L3 XXLG W/TWL (GOWN DISPOSABLE) ×2 IMPLANT
GOWN STRL REUS W/ TWL LRG LVL3 (GOWN DISPOSABLE) ×1 IMPLANT
GOWN STRL REUS W/TWL LRG LVL3 (GOWN DISPOSABLE) ×2
HANDPIECE INTERPULSE COAX TIP (DISPOSABLE) ×2
HEAD CERAMIC DELTA 36 PLUS 1.5 (Hips) ×1 IMPLANT
HOLDER FOLEY CATH W/STRAP (MISCELLANEOUS) ×2 IMPLANT
HOOD PEEL AWAY FLYTE STAYCOOL (MISCELLANEOUS) ×8 IMPLANT
JET LAVAGE IRRISEPT WOUND (IRRIGATION / IRRIGATOR) ×2
KIT TURNOVER KIT A (KITS) IMPLANT
LAVAGE JET IRRISEPT WOUND (IRRIGATION / IRRIGATOR) ×1 IMPLANT
MANIFOLD NEPTUNE II (INSTRUMENTS) ×2 IMPLANT
MARKER SKIN DUAL TIP RULER LAB (MISCELLANEOUS) ×2 IMPLANT
NDL SAFETY ECLIPSE 18X1.5 (NEEDLE) ×1 IMPLANT
NDL SPNL 18GX3.5 QUINCKE PK (NEEDLE) ×1 IMPLANT
NEEDLE HYPO 18GX1.5 SHARP (NEEDLE) ×2
NEEDLE SPNL 18GX3.5 QUINCKE PK (NEEDLE) ×2 IMPLANT
PACK ANTERIOR HIP CUSTOM (KITS) ×2 IMPLANT
PENCIL SMOKE EVACUATOR (MISCELLANEOUS) IMPLANT
PINNACLE ALTRX PLUS 4 N 36X56 (Hips) ×1 IMPLANT
SAW OSC TIP CART 19.5X105X1.3 (SAW) ×2 IMPLANT
SCREW 6.5MMX35MM (Screw) ×1 IMPLANT
SCREW 6.5MMX40MM (Screw) ×1 IMPLANT
SEALER BIPOLAR AQUA 6.0 (INSTRUMENTS) ×2 IMPLANT
SET HNDPC FAN SPRY TIP SCT (DISPOSABLE) ×1 IMPLANT
STEM TRI LOC BPS SZ6 W GRIPTON IMPLANT
SUT ETHIBOND NAB CT1 #1 30IN (SUTURE) ×4 IMPLANT
SUT MNCRL AB 3-0 PS2 18 (SUTURE) ×2 IMPLANT
SUT MNCRL AB 4-0 PS2 18 (SUTURE) ×3 IMPLANT
SUT MON AB 2-0 CT1 36 (SUTURE) ×4 IMPLANT
SUT STRATAFIX PDO 1 14 VIOLET (SUTURE) ×2
SUT STRATFX PDO 1 14 VIOLET (SUTURE) ×1
SUT VIC AB 2-0 CT1 27 (SUTURE) ×2
SUT VIC AB 2-0 CT1 TAPERPNT 27 (SUTURE) ×1 IMPLANT
SUTURE STRATFX PDO 1 14 VIOLET (SUTURE) ×1 IMPLANT
SYR 3ML LL SCALE MARK (SYRINGE) ×2 IMPLANT
TRAY FOLEY MTR SLVR 16FR STAT (SET/KITS/TRAYS/PACK) IMPLANT
TRI LOC BPS SZ 6 W GRIPTON ×2 IMPLANT
TUBE SUCTION HIGH CAP CLEAR NV (SUCTIONS) ×2 IMPLANT
WATER STERILE IRR 1000ML POUR (IV SOLUTION) ×2 IMPLANT

## 2020-02-19 NOTE — Discharge Instructions (Signed)
°Dr. Danta Baumgardner °Joint Replacement Specialist °[facility] °[address] °[place], [place] [address] °[phone] ° ° °TOTAL HIP REPLACEMENT POSTOPERATIVE DIRECTIONS ° ° ° °Hip Rehabilitation, Guidelines Following Surgery  ° °WEIGHT BEARING °Weight bearing as tolerated with assist device (walker, cane, etc) as directed, use it as long as suggested by your surgeon or therapist, typically at least 4-6 weeks. ° °The results of a hip operation are greatly improved after range of motion and muscle strengthening exercises. Follow all safety measures which are given to protect your hip. If any of these exercises cause increased pain or swelling in your joint, decrease the amount until you are comfortable again. Then slowly increase the exercises. Call your caregiver if you have problems or questions.  ° °HOME CARE INSTRUCTIONS  °Most of the following instructions are designed to prevent the dislocation of your new hip.  °Remove items at home which could result in a fall. This includes throw rugs or furniture in walking pathways.  °Continue medications as instructed at time of discharge. °· You may have some home medications which will be placed on hold until you complete the course of blood thinner medication. °· You may start showering once you are discharged home. Do not remove your dressing. °Do not put on socks or shoes without following the instructions of your caregivers.   °Sit on chairs with arms. Use the chair arms to help push yourself up when arising.  °Arrange for the use of a toilet seat elevator so you are not sitting low.  °· Walk with walker as instructed.  °You may resume a sexual relationship in one month or when given the OK by your caregiver.  °Use walker as long as suggested by your caregivers.  °You may put full weight on your legs and walk as much as is comfortable. °Avoid periods of inactivity such as sitting longer than an hour when not asleep. This helps prevent  blood clots.  °You may return to work once you are cleared by your surgeon.  °Do not drive a car for 6 weeks or until released by your surgeon.  °Do not drive while taking narcotics.  °Wear elastic stockings for two weeks following surgery during the day but you may remove then at night.  °Make sure you keep all of your appointments after your operation with all of your doctors and caregivers. You should call the office at the above phone number and make an appointment for approximately two weeks after the date of your surgery. °Please pick up a stool softener and laxative for home use as long as you are requiring pain medications. °· ICE to the affected hip every three hours for 30 minutes at a time and then as needed for pain and swelling. Continue to use ice on the hip for pain and swelling from surgery. You may notice swelling that will progress down to the foot and ankle.  This is normal after surgery.  Elevate the leg when you are not up walking on it.   °It is important for you to complete the blood thinner medication as prescribed by your doctor. °· Continue to use the breathing machine which will help keep your temperature down.  It is common for your temperature to cycle up and down following surgery, especially at night when you are not up moving around and exerting yourself.  The breathing machine keeps your lungs expanded and your temperature down. ° °RANGE OF MOTION AND STRENGTHENING EXERCISES  °These exercises are   designed to help you keep full movement of your hip joint. Follow your caregiver's or physical therapist's instructions. Perform all exercises about fifteen times, three times per day or as directed. Exercise both hips, even if you have had only one joint replacement. These exercises can be done on a training (exercise) mat, on the floor, on a table or on a bed. Use whatever works the best and is most comfortable for you. Use music or television while you are exercising so that the exercises  are a pleasant break in your day. This will make your life better with the exercises acting as a break in routine you can look forward to.  °Lying on your back, slowly slide your foot toward your buttocks, raising your knee up off the floor. Then slowly slide your foot back down until your leg is straight again.  °Lying on your back spread your legs as far apart as you can without causing discomfort.  °Lying on your side, raise your upper leg and foot straight up from the floor as far as is comfortable. Slowly lower the leg and repeat.  °Lying on your back, tighten up the muscle in the front of your thigh (quadriceps muscles). You can do this by keeping your leg straight and trying to raise your heel off the floor. This helps strengthen the largest muscle supporting your knee.  °Lying on your back, tighten up the muscles of your buttocks both with the legs straight and with the knee bent at a comfortable angle while keeping your heel on the floor.  ° °SKILLED REHAB INSTRUCTIONS: °If the patient is transferred to a skilled rehab facility following release from the hospital, a list of the current medications will be sent to the facility for the patient to continue.  When discharged from the skilled rehab facility, please have the facility set up the patient's Home Health Physical Therapy prior to being released. Also, the skilled facility will be responsible for providing the patient with their medications at time of release from the facility to include their pain medication and their blood thinner medication. If the patient is still at the rehab facility at time of the two week follow up appointment, the skilled rehab facility will also need to assist the patient in arranging follow up appointment in our office and any transportation needs. ° °MAKE SURE YOU:  °Understand these instructions.  °Will watch your condition.  °Will get help right away if you are not doing well or get worse. ° °Pick up stool softner and  laxative for home use following surgery while on pain medications. °Do not remove your dressing. °The dressing is waterproof--it is OK to take showers. °Continue to use ice for pain and swelling after surgery. °Do not use any lotions or creams on the incision until instructed by your surgeon. °Total Hip Protocol. ° ° °

## 2020-02-19 NOTE — Op Note (Signed)
OPERATIVE REPORT  SURGEON: Rod Can, MD   ASSISTANT: Cherlynn June, PA-C.  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   PROCEDURE: Right total hip arthroplasty, anterior approach.   IMPLANTS: DePuy Tri Lock stem, size 6, hi offset. DePuy Pinnacle Cup, size 56 mm. DePuy Altrx liner, size 36 by 56 mm, +4 neutral. DePuy Biolox ceramic head ball, size 36 + 1.5 mm. 6.5 mm cancellous bone screws x2.  ANESTHESIA:  MAC and Spinal  ESTIMATED BLOOD LOSS:-400 mL    ANTIBIOTICS: 2g Ancef.  DRAINS: None.  COMPLICATIONS: None.   CONDITION: PACU - hemodynamically stable.   BRIEF CLINICAL NOTE: Patrick Moreno is a 65 y.o. male with a long-standing history of Right hip arthritis. After failing conservative management, the patient was indicated for total hip arthroplasty. The risks, benefits, and alternatives to the procedure were explained, and the patient elected to proceed.  PROCEDURE IN DETAIL: Surgical site was marked by myself. Once inside the operative room, spinal anesthesia was obtained.  Foley catheter insertion was inserted without any difficulty. The patient was then positioned on the Hana table. All bony prominences were well padded. The hip was prepped and draped in the normal sterile surgical fashion. A time-out was called verifying side and site of surgery. The patient received IV antibiotics within 60 minutes of beginning the procedure.  The direct anterior approach to the hip was performed through the Hueter interval. Lateral femoral circumflex vessels were treated with the Auqumantys. The anterior capsule was exposed and an inverted T capsulotomy was made.The femoral neck cut was made to the level of the templated cut. A corkscrew was placed into the head and the head was removed. The femoral head was found to be flattened and have eburnated bone. The head was  passed to the back table and was measured.  Acetabular exposure was achieved, and the pulvinar and labrum were excised.  There is a complex acetabular deformity with an egg shaped introitus.  There was erosion of cancellous bone stock in the posterior column.  I began with a 49 mm reamer using both direct visualization and live fluoroscopy.  Sequental reaming of the acetabulum was then performed up to a size 55 mm reamer, taking care to keep the hip center low and lateral. Bone graft was harvested from the native head using a reamer. I placed the bone graft into the posterior column. I impacted the bone graft with a 54 mm reamer on reverse. A 56 mm cup was then opened and impacted into place at approximately 40 degrees of abduction and 20 degrees of anteversion. The cup achieved excellent press fit, and I chose to augment the fixation with 6.5 mm cancellous screws x2. The final polyethylene liner was impacted into place and acetabular osteophytes were removed.   I then gained femoral exposure taking care to protect the abductors and greater trochanter. This was performed using standard external rotation, extension, and adduction. The capsule was peeled off the inner aspect of the greater trochanter, taking care to preserve the short external rotators. A cookie cutter was used to enter the femoral canal, and then the femoral canal finder was placed. Sequential broaching was performed up to a size 6. Calcar planer was used on the femoral neck remnant. I paced a hi offset neck and a trial head ball. The hip was reduced. Leg lengths and offset were checked fluoroscopically. The hip was dislocated and trial components were  removed. The final implants were placed, and the hip was reduced.  Fluoroscopy was used to confirm component position and leg lengths. At 90 degrees of external rotation and full extension, the hip was stable to an anterior directed force.  The wound was copiously irrigated with  Irrisept solution and normal saline using pule lavage. Marcaine solution was injected into the periarticular soft tissue. The wound was closed in layers using #1 strata fix for the fascia, 2-0 Vicryl for the subcutaneous fat, 2-0 Monocryl for the deep dermal layer, 3-0 running Monocryl subcuticular stitch, and Dermabond for the skin. Once the glue was fully dried, an Aquacell Ag dressing was applied. The patient was transported to the recovery room in stable condition. Sponge, needle, and instrument counts were correct at the end of the case x2. The patient tolerated the procedure well and there were no known complications.  Please note that a surgical assistant was a medical necessity for this procedure to perform it in a safe and expeditious manner. Assistant was necessary to provide appropriate retraction of vital neurovascular structures, to prevent femoral fracture, and to allow for anatomic placement of the prosthesis.  POSTOPERATIVE PLAN: We will plan for discharge home from the postanesthesia care unit. He may weight-bear as tolerated with assist device.  Mobilize out of bed with physical therapy. Aspirin 81 mg PO BID x6 weeks for DVT ppx. Routine orthopedic postop care in 2 weeks.

## 2020-02-19 NOTE — Interval H&P Note (Signed)
History and Physical Interval Note:  02/19/2020 8:22 AM  Patrick Moreno  has presented today for surgery, with the diagnosis of Right hip osteoarthritis.  The various methods of treatment have been discussed with the patient and family. After consideration of risks, benefits and other options for treatment, the patient has consented to  Procedure(s): TOTAL HIP ARTHROPLASTY ANTERIOR APPROACH (Right) as a surgical intervention.  The patient's history has been reviewed, patient examined, no change in status, stable for surgery.  I have reviewed the patient's chart and labs.  Questions were answered to the patient's satisfaction.    The risks, benefits, and alternatives were discussed with the patient. There are risks associated with the surgery including, but not limited to, problems with anesthesia (death), infection, instability (giving out of the joint), dislocation, differences in leg length/angulation/rotation, fracture of bones, loosening or failure of implants, hematoma (blood accumulation) which may require surgical drainage, blood clots, pulmonary embolism, nerve injury (foot drop and lateral thigh numbness), and blood vessel injury. The patient understands these risks and elects to proceed.    Iline Oven Maisha Bogen

## 2020-02-19 NOTE — Evaluation (Signed)
Physical Therapy Evaluation Patient Details Name: Patrick Moreno MRN: [id number] DOB: 27-Jul-1955 Today's Date: 02/19/2020   History of Present Illness  Patient is 65 y.o. male s/p Rt THA anterior approach on 02/19/20 with PMH significant for HTN, OA, HLD, schizoprenia, Lt THA anterior approach on 10/30/19.    Clinical Impression  Patrick Moreno is a 65 y.o. male POD 0 s/p Rt THA. Patient reports independence with mobility at baseline. Patient is now limited by functional impairments (see PT problem list below) and requires min guard/supervision for transfers and gait with RW. Patient was able to ambulate ~120 feet with RW and min guard/supervision and cues for safe walker management. Patient educated on safe sequencing for stair mobility and verbalized safe guarding position for people assisting with mobility. Patient instructed in exercises to facilitate ROM and circulation. Patient will benefit from continued skilled PT interventions to address impairments and progress towards PLOF. Patient has met mobility goals at adequate level for discharge home; will continue to follow if pt continues acute stay to progress towards Mod I goals.     Follow Up Recommendations Follow surgeon's recommendation for DC plan and follow-up therapies;Outpatient PT (pt has goal to go to gym, would benefit from T[place] first.)    Equipment Recommendations  None recommended by PT    Recommendations for Other Services       Precautions / Restrictions Precautions Precautions: Fall Restrictions Weight Bearing Restrictions: No Other Position/Activity Restrictions: WBAT      Mobility  Bed Mobility Overal bed mobility: Needs Assistance Bed Mobility: Supine to Sit;Sit to Supine     Supine to sit: Supervision Sit to supine: Supervision   General bed mobility comments: pt using bed rail to sit up to EOB, no extra time needed. pt able to bring Bil LE's into bed without assist or use of belt.    Transfers Overall  transfer level: Needs assistance Equipment used: Rolling walker (2 wheeled) Transfers: Sit to/from Stand Sit to Stand: Min guard;Supervision         General transfer comment: Cues for safe hand placement with RW. no assist needed to rise and pt steady in standing.  Ambulation/Gait Ambulation/Gait assistance: Min guard;Supervision Gait Distance (Feet): 120 Feet Assistive device: Rolling walker (2 wheeled) Gait Pattern/deviations: Step-to pattern;Decreased stride length;Decreased weight shift to right Gait velocity: decr   General Gait Details: cues for safe step pattern and proximity to RW, no overt LOB during gait and pt maintained safe position to walker throughout. intermittent cues <25% to roll walker and not lift it off ground.  Stairs            Wheelchair Mobility    Modified Rankin (Stroke Patients Only)       Balance Overall balance assessment: Needs assistance Sitting-balance support: Feet supported Sitting balance-Leahy Scale: Good     Standing balance support: During functional activity;Bilateral upper extremity supported Standing balance-Leahy Scale: Fair                               Pertinent Vitals/Pain Pain Assessment: 0-10 Pain Score: 4  Pain Location: Rt hip Pain Descriptors / Indicators: Burning Pain Intervention(s): Limited activity within patient's tolerance;Monitored during session;Repositioned    Home Living Family/patient expects to be discharged to:: Private residence Living Arrangements: Other relatives Available Help at Discharge: Family Type of Home: Apartment Home Access: Level entry   Entrance Stairs-Number of Steps: sidewalk cutout Home Layout: One level Home Equipment: Environmental consultant - 2  wheels;Cane - single point;Grab bars - tub/shower Additional Comments: pt's brothers will be staying with him and helping him while he recovers. wills tay for 1 week.    Prior Function Level of Independence: Independent                Hand Dominance   Dominant Hand: Right    Extremity/Trunk Assessment   Upper Extremity Assessment Upper Extremity Assessment: Overall WFL for tasks assessed    Lower Extremity Assessment Lower Extremity Assessment: Overall WFL for tasks assessed    Cervical / Trunk Assessment Cervical / Trunk Assessment: Normal  Communication   Communication: HOH  Cognition Arousal/Alertness: Awake/alert Behavior During Therapy: WFL for tasks assessed/performed Overall Cognitive Status: Within Functional Limits for tasks assessed                                        General Comments      Exercises Total Joint Exercises Ankle Circles/Pumps: AROM;Both;20 reps;Supine Quad Sets: AROM;Right;5 reps;Supine Short Arc Quad: AROM;Right;5 reps;Supine Heel Slides: AROM;Right;5 reps;Supine Hip ABduction/ADduction: AROM;Right;5 reps;Supine   Assessment/Plan    PT Assessment Patient needs continued PT services  PT Problem List Decreased strength;Decreased range of motion;Decreased activity tolerance;Decreased balance;Decreased mobility;Decreased knowledge of use of DME;Decreased knowledge of precautions;Pain       PT Treatment Interventions DME instruction;Gait training;Stair training    PT Goals (Current goals can be found in the Care Plan section)  Acute Rehab PT Goals Patient Stated Goal: be able to go to they gym again PT Goal Formulation: With patient Time For Goal Achievement: 02/26/20 Potential to Achieve Goals: Good    Frequency 7X/week   Barriers to discharge        Co-evaluation               AM-PAC PT "6 Clicks" Mobility  Outcome Measure Help needed turning from your back to your side while in a flat bed without using bedrails?: None Help needed moving from lying on your back to sitting on the side of a flat bed without using bedrails?: None Help needed moving to and from a bed to a chair (including a wheelchair)?: A Little Help needed standing  up from a chair using your arms (e.g., wheelchair or bedside chair)?: A Little Help needed to walk in hospital room?: A Little Help needed climbing 3-5 steps with a railing? : A Little 6 Click Score: 20    End of Session Equipment Utilized During Treatment: Gait belt Activity Tolerance: Patient tolerated treatment well Patient left: in bed;with call bell/phone within reach Nurse Communication: Mobility status PT Visit Diagnosis: Muscle weakness (generalized) (M62.81);Difficulty in walking, not elsewhere classified (R26.2)    Time: 6384-5364 PT Time Calculation (min) (ACUTE ONLY): 32 min   Charges:   PT Evaluation $PT Eval Low Complexity: 1 Low PT Treatments $Gait Training: 8-22 mins        Verner Mould, DPT Acute Rehabilitation Services Office [phone] Pager [phone]    Jacques Navy 02/19/2020, 3:43 PM

## 2020-02-19 NOTE — Anesthesia Procedure Notes (Signed)
Spinal  Patient location during procedure: OR Start time: 02/19/2020 8:37 AM End time: 02/19/2020 8:40 AM Staffing Performed: anesthesiologist  Anesthesiologist: [name], MD Preanesthetic Checklist Completed: patient identified, IV checked, risks and benefits discussed, surgical consent, monitors and equipment checked, pre-op evaluation and timeout performed Spinal Block Patient position: sitting Prep: DuraPrep and site prepped and draped Patient monitoring: continuous pulse ox, blood pressure and heart rate Approach: right paramedian Location: L3-4 Injection technique: single-shot Needle Needle type: Pencan  Needle gauge: 24 G Needle length: 9 cm Additional Notes Risks, benefits, and alternative discussed. Patient gave consent to procedure. Prepped and draped in sitting position. Patient sedated but responsive to voice. First attempt midline, unable to access interspinous space. Second attempt right paramedian with clear CSF obtained. Positive terminal aspiration. No pain or paraesthesias with injection. Patient tolerated procedure well. Vital signs stable. Amalia Greenhouse, MD

## 2020-02-19 NOTE — Transfer of Care (Signed)
Immediate Anesthesia Transfer of Care Note  Patient: Patrick Moreno  Procedure(s) Performed: TOTAL HIP ARTHROPLASTY ANTERIOR APPROACH (Right Hip)  Patient Location: PACU  Anesthesia Type:MAC and Spinal  Level of Consciousness: sedated and responds to stimulation  Airway & Oxygen Therapy: Patient Spontanous Breathing and Patient connected to face mask oxygen  Post-op Assessment: Report given to RN and Post -op Vital signs reviewed and stable  Post vital signs: Reviewed and stable  Last Vitals:  Vitals Value Taken Time  BP    Temp    Pulse 73 02/19/20 1112  Resp 14 02/19/20 1112  SpO2 98 % 02/19/20 1112  Vitals shown include unvalidated device data.  Last Pain:  Vitals:   02/19/20 0706  TempSrc: Oral  PainSc:       Patients Stated Pain Goal: 4 (24/93/24 1991)  Complications: No complications documented.

## 2020-02-19 NOTE — Care Plan (Signed)
Ortho Bundle Case Management Note  Patient Details  Name: Eliazar Olivar MRN: [id number] Date of Birth: [birth year]                  R THA on 02/19/20. DCP: Home with brother. 1 story home with 0 steps. DME: No needs. Has RW. Doesn't need 3in1. PT: HEP   DME Arranged:  N/A DME Agency:     HH Arranged:    HH Agency:     Additional Comments: Please contact me with any questions of if this plan should need to change.  Ennis Forts, RN,CCM [facility]  [phone] 02/19/2020, 8:18 AM

## 2020-02-19 NOTE — Anesthesia Postprocedure Evaluation (Signed)
Anesthesia Post Note  Patient: Patrick Moreno  Procedure(s) Performed: TOTAL HIP ARTHROPLASTY ANTERIOR APPROACH (Right Hip)     Patient location during evaluation: PACU Anesthesia Type: Spinal Level of consciousness: awake and alert and oriented Pain management: pain level controlled Vital Signs Assessment: post-procedure vital signs reviewed and stable Respiratory status: spontaneous breathing, nonlabored ventilation and respiratory function stable Cardiovascular status: blood pressure returned to baseline Postop Assessment: no apparent nausea or vomiting, spinal receding, no headache and no backache Anesthetic complications: no   No complications documented.  Last Vitals:  Vitals:   02/19/20 1215 02/19/20 1318  BP: 117/63 119/74  Pulse: (!) 57 66  Resp: 16 16  Temp:  36.9 C  SpO2: 97% 98%    Last Pain:  Vitals:   02/19/20 1318  TempSrc: Oral  PainSc:                  [name]

## 2020-02-20 ENCOUNTER — Encounter (HOSPITAL_COMMUNITY): Payer: Self-pay | Admitting: Orthopedic Surgery

## 2020-02-24 ENCOUNTER — Telehealth (HOSPITAL_COMMUNITY): Payer: Medicare Other | Admitting: Psychiatry

## 2020-03-03 DIAGNOSIS — Z471 Aftercare following joint replacement surgery: Secondary | ICD-10-CM | POA: Diagnosis not present

## 2020-03-03 DIAGNOSIS — Z96641 Presence of right artificial hip joint: Secondary | ICD-10-CM | POA: Diagnosis not present

## 2020-03-31 DIAGNOSIS — Z96641 Presence of right artificial hip joint: Secondary | ICD-10-CM | POA: Diagnosis not present

## 2020-03-31 DIAGNOSIS — Z471 Aftercare following joint replacement surgery: Secondary | ICD-10-CM | POA: Diagnosis not present

## 2020-05-04 ENCOUNTER — Other Ambulatory Visit: Payer: Self-pay

## 2020-05-04 ENCOUNTER — Encounter (HOSPITAL_COMMUNITY): Payer: Self-pay | Admitting: Psychiatry

## 2020-05-04 ENCOUNTER — Telehealth (INDEPENDENT_AMBULATORY_CARE_PROVIDER_SITE_OTHER): Payer: Medicare Other | Admitting: Psychiatry

## 2020-05-04 DIAGNOSIS — F200 Paranoid schizophrenia: Secondary | ICD-10-CM | POA: Diagnosis not present

## 2020-05-04 MED ORDER — RISPERIDONE 1 MG PO TABS: ORAL_TABLET | ORAL | 2 refills | Status: DC

## 2020-05-04 NOTE — Progress Notes (Signed)
Virtual Visit via Telephone Note  I connected with Lucy Antigua on 05/04/20 at  9:00 AM EDT by telephone and verified that I am speaking with the correct person using two identifiers.  Location: Patient: Home Provider: Home Office   I discussed the limitations, risks, security and privacy concerns of performing an evaluation and management service by telephone and the availability of in person appointments. I also discussed with the patient that there may be a patient responsible charge related to this service. The patient expressed understanding and agreed to proceed.   History of Present Illness: Patient is evaluated by phone session.  He is doing well and reported no major concerns.  Denies any paranoia, hallucination, anger or any crying spells.  He had hip replacement few weeks ago and he is recovering very well.  He is not taking any pain medicine and start walking 4-5 times a day and he feels things are slowly and gradually getting better.  He had a good support from his neighbors and family member.  He is taking Risperdal 1 mg in the morning and 2 mg at bedtime.  He has no tremors, shakes or any EPS.  He does not want any therapy.  He denies any agitation or any trust issues.  He admitted few pounds weight gain but otherwise no major concern.  His energy level is good.  He denies drinking or using any illegal substances.    Past Psychiatric History: H/Oschizophrenia since 65. H/Opsychotic episode whileworking in [place]. No h/o inpatient orsuicidalattempt. H/OtakingStelazine.   Recent Results (from the past 2160 hour(s))  Urinalysis, Routine w reflex microscopic     Status: Abnormal   Collection Time: 02/12/20  9:21 AM  Result Value Ref Range   Color, Urine YELLOW YELLOW   APPearance CLEAR CLEAR   Specific Gravity, Urine 1.021 1.005 - 1.030   pH 5.0 5.0 - 8.0   Glucose, UA NEGATIVE NEGATIVE mg/dL   Hgb urine dipstick MODERATE (A) NEGATIVE   Bilirubin Urine  NEGATIVE NEGATIVE   Ketones, ur NEGATIVE NEGATIVE mg/dL   Protein, ur NEGATIVE NEGATIVE mg/dL   Nitrite NEGATIVE NEGATIVE   Leukocytes,Ua NEGATIVE NEGATIVE   RBC / HPF 6-10 0 - 5 RBC/hpf   WBC, UA 0-5 0 - 5 WBC/hpf   Bacteria, UA RARE (A) NONE SEEN   Mucus PRESENT     Comment: Performed at [facility], [address] [place], [place] [address]  Surgical pcr screen     Status: None   Collection Time: 02/12/20  9:22 AM   Specimen: Nasal Mucosa; Nasal Swab  Result Value Ref Range   MRSA, PCR NEGATIVE NEGATIVE   Staphylococcus aureus NEGATIVE NEGATIVE    Comment: (NOTE) The Xpert SA Assay (FDA approved for NASAL specimens in patients 71 years of age and older), is one component of a comprehensive surveillance program. It is not intended to diagnose infection nor to guide or monitor treatment. Performed at [facility], [address] [place], [place] [address]   CBC     Status: None   Collection Time: 02/12/20 10:05 AM  Result Value Ref Range   WBC 5.9 4.0 - 10.5 K/uL   RBC 4.57 4.22 - 5.81 MIL/uL   Hemoglobin 13.6 13.0 - 17.0 g/dL   HCT 83.1 51.7 - 61.6 %   MCV 89.5 80.0 - 100.0 fL   MCH 29.8 26.0 - 34.0 pg   MCHC 33.3 30.0 - 36.0 g/dL   RDW 07.3 71.0 - 62.6 %  Platelets 202 150 - 400 K/uL   nRBC 0.0 0.0 - 0.2 %    Comment: Performed at [facility], [address], [place], [place] [address]  Comprehensive metabolic panel     Status: Abnormal   Collection Time: 02/12/20 10:05 AM  Result Value Ref Range   Sodium 138 135 - 145 mmol/L   Potassium 3.4 (L) 3.5 - 5.1 mmol/L   Chloride 102 98 - 111 mmol/L   CO2 26 22 - 32 mmol/L   Glucose, Bld 117 (H) 70 - 99 mg/dL    Comment: Glucose reference range applies only to samples taken after fasting for at least 8 hours.   BUN 30 (H) 8 - 23 mg/dL   Creatinine, Ser 5.62 0.61 - 1.24 mg/dL   Calcium 9.4 8.9 - 56.3 mg/dL   Total Protein 6.6 6.5 - 8.1 g/dL   Albumin 4.3 3.5 -  5.0 g/dL   AST 16 15 - 41 U/L   ALT 20 0 - 44 U/L   Alkaline Phosphatase 79 38 - 126 U/L   Total Bilirubin 0.5 0.3 - 1.2 mg/dL   GFR, Estimated >89 >37 mL/min    Comment: (NOTE) Calculated using the CKD-EPI Creatinine Equation (2021)    Anion gap 10 5 - 15    Comment: Performed at [facility], [address], [place], [place] [address]  Protime-INR     Status: None   Collection Time: 02/12/20 10:05 AM  Result Value Ref Range   Prothrombin Time 12.3 11.4 - 15.2 seconds   INR 1.0 0.8 - 1.2    Comment: (NOTE) INR goal varies based on device and disease states. Performed at [facility], [address], [place], [place] [address]   Type and screen Order type and screen if day of surgery is less than 15 days from draw of preadmission visit or order morning of surgery if day of surgery is greater than 6 days from preadmission visit.     Status: None   Collection Time: 02/12/20 10:05 AM  Result Value Ref Range   ABO/RH(D) O POS    Antibody Screen NEG    Sample Expiration 02/22/2020,2359    Extend sample reason      NO TRANSFUSIONS OR PREGNANCY IN THE PAST 3 MONTHS Performed at [facility], [address], [place], [place] [address]   SARS CORONAVIRUS 2 (TAT 6-24 HRS) Nasopharyngeal Nasopharyngeal Swab     Status: None   Collection Time: 02/15/20  1:46 PM   Specimen: Nasopharyngeal Swab  Result Value Ref Range   SARS Coronavirus 2 NEGATIVE NEGATIVE    Comment: (NOTE) SARS-CoV-2 target nucleic acids are NOT DETECTED.  The SARS-CoV-2 RNA is generally detectable in upper and lower respiratory specimens during the acute phase of infection. Negative results do not preclude SARS-CoV-2 infection, do not rule out co-infections with other pathogens, and should not be used as the sole basis for treatment or other patient management decisions. Negative results must be combined with clinical observations, patient history, and  epidemiological information. The expected result is Negative.  Fact Sheet for Patients: HairSlick.no  Fact Sheet for Healthcare Providers: quierodirigir.com  This test is not yet approved or cleared by the [place] FDA and  has been authorized for detection and/or diagnosis of SARS-CoV-2 by FDA under an Emergency Use Authorization (EUA). This EUA will remain  in effect (meaning this test can be used) for the duration of the COVID-19 declaration under Se  ction 564(b)(1) of the Act, 21 U.S.C. section 360bbb-3(b)(1), unless the authorization is terminated or revoked sooner.  Performed at [facility], [address] [place], [place] [address]     Psychiatric Specialty Exam: Physical Exam  Review of Systems  Weight 174 lb (78.9 kg).There is no height or weight on file to calculate BMI.  General Appearance: NA  Eye Contact:  NA  Speech:  Slow  Volume:  Decreased  Mood:  Euthymic  Affect:  NA  Thought Process:  Descriptions of Associations: Intact  Orientation:  Full (Time, Place, and Person)  Thought Content:  Logical  Suicidal Thoughts:  No  Homicidal Thoughts:  No  Memory:  Immediate;   Good Recent;   Good Remote;   Good  Judgement:  Intact  Insight:  Present  Psychomotor Activity:  NA  Concentration:  Concentration: Fair and Attention Span: Fair  Recall:  Good  Fund of Knowledge:  Fair  Language:  Good  Akathisia:  No  Handed:  Right  AIMS (if indicated):     Assets:  Communication Skills Desire for Improvement Housing Resilience Social Support Transportation  ADL's:  Intact  Cognition:  WNL  Sleep:   Good      Assessment and Plan: Schizophrenia chronic paranoid type.  Patient is a stable on his current medication.  Used to take higher dose of Risperdal but we have gradually cutting down the dose since patient has been stable and now he is taking 1 mg in the morning and 2 at bedtime.  He  is reluctant to cut down further since he feels stable.  I reviewed blood work results.  I will continue current dose of Risperdal.  A new prescription of Risperdal 1 mg to take 1 in the morning and 2 tablet at bedtime is given to his local pharmacy.  Recommended to call us back if is any question or any concern.  Follow-up in 3 months.  Follow Up Instructions:    I discussed the assessment and treatment plan with the patient. The patient was provided an opportunity to ask questions and all were answered. The patient agreed with the plan and demonstrated an understanding of the instructions.   The patient was advised to call back or seek an in-person evaluation if the symptoms worsen or if the condition fails to improve as anticipated.  I provided 16 minutes of non-face-to-face time during this encounter.   Cleotis Nipper, MD

## 2020-05-13 ENCOUNTER — Telehealth (HOSPITAL_COMMUNITY): Payer: Self-pay | Admitting: *Deleted

## 2020-05-13 DIAGNOSIS — F200 Paranoid schizophrenia: Secondary | ICD-10-CM

## 2020-05-13 MED ORDER — RISPERIDONE 2 MG PO TABS: ORAL_TABLET | ORAL | 1 refills | Status: DC

## 2020-05-13 NOTE — Telephone Encounter (Signed)
Patient could not get authorized for the Risperdal 1mg  am and 2pm due to .  I was able to get Risperdal 2mg  1/2 am and 1hs. Can I put in this order.

## 2020-05-13 NOTE — Telephone Encounter (Signed)
I did Refill. Please inform the patient with new direction and dosage. Thanks

## 2020-08-04 ENCOUNTER — Encounter (HOSPITAL_COMMUNITY): Payer: Self-pay | Admitting: Psychiatry

## 2020-08-04 ENCOUNTER — Telehealth (INDEPENDENT_AMBULATORY_CARE_PROVIDER_SITE_OTHER): Payer: Medicare Other | Admitting: Psychiatry

## 2020-08-04 ENCOUNTER — Other Ambulatory Visit: Payer: Self-pay

## 2020-08-04 DIAGNOSIS — F200 Paranoid schizophrenia: Secondary | ICD-10-CM

## 2020-08-04 MED ORDER — RISPERIDONE 2 MG PO TABS
ORAL_TABLET | ORAL | 1 refills | Status: DC
Start: 2020-08-04 — End: 2021-02-05

## 2020-08-04 MED ORDER — RISPERIDONE 2 MG PO TABS: ORAL_TABLET | ORAL | 0 refills | Status: DC

## 2020-08-04 NOTE — Progress Notes (Addendum)
Virtual Visit via Telephone Note  I connected with Lucy Antigua on 08/04/20 at 10:20 AM EDT by telephone and verified that I am speaking with the correct person using two identifiers.  Location: Patient: Home Provider: Home Office   I discussed the limitations, risks, security and privacy concerns of performing an evaluation and management service by telephone and the availability of in person appointments. I also discussed with the patient that there may be a patient responsible charge related to this service. The patient expressed understanding and agreed to proceed.   History of Present Illness: Patient is evaluated by phone session.  He is now taking Resporal 2 mg only at bedtime.  He had some difficulty getting 1 mg so he chooses to take only 2 mg at bedtime.  He actually doing much better.  We have recommended to cut down the dose since he has been doing well on the last visit.  He used to take 4 mg but slowly and gradually the dose has been reduced.  He feels more active, and start walking on a regular basis every day.  He had a total hip surgery in January and since then he started more ambulatory.  He has upcoming appointment to see his PCP in November Dr. Benedetto Goad.  Denies any paranoia, hallucination, anger, irritability.  He sleeps 7 to 8 hours.  He had a good support from his neighbors and family members.  He denies any trust issues or any agitation.  He wants to keep 2 mg at bedtime.  So far he is tolerating his medication and has no tremors, shakes or any EPS.  His appetite is okay.  His weight is stable.  Denies drinking or using any illegal substances.  Past Psychiatric History:  H/O schizophrenia since 63.  H/O psychotic episode while working in [place].  No h/o inpatient or suicidal attempt. H/O taking Stelazine.    Psychiatric Specialty Exam: Physical Exam  Review of Systems  Weight 175 lb (79.4 kg).There is no height or weight on file to calculate BMI.  General  Appearance: NA  Eye Contact:  NA  Speech:  Slow  Volume:  Decreased  Mood:  Euthymic  Affect:  NA  Thought Process:  Goal Directed  Orientation:  Full (Time, Place, and Person)  Thought Content:  WDL  Suicidal Thoughts:  No  Homicidal Thoughts:  No  Memory:  Immediate;   Good Recent;   Good Remote;   Fair  Judgement:  Intact  Insight:  Present  Psychomotor Activity:  NA  Concentration:  Concentration: Fair and Attention Span: Fair  Recall:  Good  Fund of Knowledge:  Good  Language:  Good  Akathisia:  No  Handed:  Right  AIMS (if indicated):     Assets:  Communication Skills Desire for Improvement Housing Resilience Transportation  ADL's:  Intact  Cognition:  WNL  Sleep:   ok      Assessment and Plan: Schizophrenia chronic paranoid type.  Patient is now taking Resporal 2 mg only at bedtime.  He stopped taking 1 mg as he noticed improvement and he also have issues getting 1 mg at pharmacy.  In the past he was not comfortable cutting down the medication but now he noticed it helps his energy level.  He is walking every day.  I recommend to continue Risperdal 2 mg at bedtime however if he noticed symptoms coming back then he should call us immediately.  Recommended to call us back if worsening of symptoms.  Continue Risperdal 2 mg at bedtime.  Follow up in 6 months.  We will do hemoglobin A1c as he has not done in a while.  Follow Up Instructions:    I discussed the assessment and treatment plan with the patient. The patient was provided an opportunity to ask questions and all were answered. The patient agreed with the plan and demonstrated an understanding of the instructions.   The patient was advised to call back or seek an in-person evaluation if the symptoms worsen or if the condition fails to improve as anticipated.  I provided 16 minutes of non-face-to-face time during this encounter.   Cleotis Nipper, MD

## 2020-08-11 DIAGNOSIS — Z96643 Presence of artificial hip joint, bilateral: Secondary | ICD-10-CM | POA: Diagnosis not present

## 2020-12-16 DIAGNOSIS — Z0000 Encounter for general adult medical examination without abnormal findings: Secondary | ICD-10-CM | POA: Diagnosis not present

## 2020-12-16 DIAGNOSIS — Z96641 Presence of right artificial hip joint: Secondary | ICD-10-CM | POA: Diagnosis not present

## 2020-12-16 DIAGNOSIS — F200 Paranoid schizophrenia: Secondary | ICD-10-CM | POA: Diagnosis not present

## 2020-12-16 DIAGNOSIS — Z125 Encounter for screening for malignant neoplasm of prostate: Secondary | ICD-10-CM | POA: Diagnosis not present

## 2020-12-16 DIAGNOSIS — I10 Essential (primary) hypertension: Secondary | ICD-10-CM | POA: Diagnosis not present

## 2020-12-16 DIAGNOSIS — Z23 Encounter for immunization: Secondary | ICD-10-CM | POA: Diagnosis not present

## 2021-01-17 DIAGNOSIS — Z23 Encounter for immunization: Secondary | ICD-10-CM | POA: Diagnosis not present

## 2021-02-05 ENCOUNTER — Other Ambulatory Visit: Payer: Self-pay

## 2021-02-05 ENCOUNTER — Encounter (HOSPITAL_COMMUNITY): Payer: Self-pay | Admitting: Psychiatry

## 2021-02-05 ENCOUNTER — Telehealth (HOSPITAL_BASED_OUTPATIENT_CLINIC_OR_DEPARTMENT_OTHER): Payer: Medicare Other | Admitting: Psychiatry

## 2021-02-05 DIAGNOSIS — F200 Paranoid schizophrenia: Secondary | ICD-10-CM

## 2021-02-05 MED ORDER — RISPERIDONE 2 MG PO TABS: ORAL_TABLET | ORAL | 1 refills | Status: DC

## 2021-02-05 NOTE — Progress Notes (Signed)
Virtual Visit via Telephone Note  I connected with Patrick Moreno on 02/05/21 at 10:00 AM EST by telephone and verified that I am speaking with the correct person using two identifiers.  Location: Patient: Home Provider: Home Office   I discussed the limitations, risks, security and privacy concerns of performing an evaluation and management service by telephone and the availability of in person appointments. I also discussed with the patient that there may be a patient responsible charge related to this service. The patient expressed understanding and agreed to proceed.   History of Present Illness: Patient is evaluated by phone session.  He is taking Risperdal 2 mg at bedtime.  He is sleeping good.  Denies any paranoia, hallucination or any anger.  His Christmas was busy because he is taking care of elderly brother who has spinal procedure.  The patient has upcoming appointment for yearly checkup after his hip surgery.  He is feeling good.  He has a blood work in November by his PCP Dr. Redmond Pulling.  His hemoglobin A1c is 5.2.  He admitted weight gain and is trying to lose weight.  He had a good support from his neighbors and family members.  He denies any agitation.  He has no tremor or shakes or any EPS.  Past Psychiatric History:  H/O schizophrenia since 68.  H/O psychotic episode while working in [place].  No h/o inpatient or suicidal attempt. H/O taking Stelazine.    Psychiatric Specialty Exam: Physical Exam  Review of Systems  Weight 190 lb (86.2 kg).There is no height or weight on file to calculate BMI.  General Appearance: NA  Eye Contact:  NA  Speech:  Normal Rate  Volume:  Normal  Mood:  Euthymic  Affect:  NA  Thought Process:  Goal Directed  Orientation:  Full (Time, Place, and Person)  Thought Content:  Logical  Suicidal Thoughts:  No  Homicidal Thoughts:  No  Memory:  Immediate;   Good Recent;   Good Remote;   Good  Judgement:  Intact  Insight:  Present   Psychomotor Activity:  NA  Concentration:  Concentration: Good and Attention Span: Good  Recall:  Good  Fund of Knowledge:  Good  Language:  Good  Akathisia:  No  Handed:  Right  AIMS (if indicated):     Assets:  Communication Skills Desire for Improvement Housing Transportation  ADL's:  Intact  Cognition:  WNL  Sleep:   ok      Assessment and Plan: Schizophrenia chronic paranoid type.  Patient is doing better on Risperdal 2 mg at bedtime.  He used to take 4 mg but dose has been gradually cut down.  Encouraged exercise and walking.  Recommended to call us back if is any question or any concern.  Follow-up in 6 months  Follow Up Instructions:    I discussed the assessment and treatment plan with the patient. The patient was provided an opportunity to ask questions and all were answered. The patient agreed with the plan and demonstrated an understanding of the instructions.   The patient was advised to call back or seek an in-person evaluation if the symptoms worsen or if the condition fails to improve as anticipated.  I provided 19 minutes of non-face-to-face time during this encounter.   Kathlee Nations, MD

## 2021-02-16 DIAGNOSIS — Z96643 Presence of artificial hip joint, bilateral: Secondary | ICD-10-CM | POA: Diagnosis not present

## 2021-05-03 DIAGNOSIS — H6122 Impacted cerumen, left ear: Secondary | ICD-10-CM | POA: Diagnosis not present

## 2021-05-03 DIAGNOSIS — R053 Chronic cough: Secondary | ICD-10-CM | POA: Diagnosis not present

## 2021-05-04 DIAGNOSIS — R053 Chronic cough: Secondary | ICD-10-CM | POA: Diagnosis not present

## 2021-08-05 ENCOUNTER — Encounter (HOSPITAL_COMMUNITY): Payer: Self-pay | Admitting: Psychiatry

## 2021-08-05 ENCOUNTER — Telehealth (HOSPITAL_BASED_OUTPATIENT_CLINIC_OR_DEPARTMENT_OTHER): Payer: Medicare Other | Admitting: Psychiatry

## 2021-08-05 DIAGNOSIS — F200 Paranoid schizophrenia: Secondary | ICD-10-CM | POA: Diagnosis not present

## 2021-08-05 MED ORDER — RISPERIDONE 2 MG PO TABS: ORAL_TABLET | ORAL | 1 refills | Status: DC

## 2021-08-05 NOTE — Progress Notes (Signed)
Virtual Visit via Telephone Note  I connected with Patrick Moreno on 08/05/21 at 10:00 AM EDT by telephone and verified that I am speaking with the correct person using two identifiers.  Location: Patient: Home Provider: Home Office   I discussed the limitations, risks, security and privacy concerns of performing an evaluation and management service by telephone and the availability of in person appointments. I also discussed with the patient that there may be a patient responsible charge related to this service. The patient expressed understanding and agreed to proceed.   History of Present Illness: Patient is evaluated by phone session.  He is taking Risperdal 2 mg in time.  He used to take 4 mg by we have slowly and gradually cut it down to the lower dose.  He admitted excessive weight gain because he is not watching his calorie intake and drinking and eating sweets things.  Now he started [employer] and trying to lose weight.  His last hemoglobin A1c was 5.2 which was done in November.  He has another blood work coming in November.  Denies any agitation, anger, mania, psychosis or any hallucination.  He sleeps good.  He tried to see his nephew who is married and lives in [place] with his wife.  Recently did have a 62-month-old baby and patient is happy to be great uncle.  He denies any tremors shakes or any EPS.  He lives by himself but he had a good support from his neighbors.  His birthday is coming up next month.  Patient denies drinking or using any illegal substances.  Past Psychiatric History:  H/O schizophrenia since 47.  H/O psychotic episode while working in [place].  No h/o inpatient or suicidal attempt. H/O taking Stelazine.     Psychiatric Specialty Exam: Physical Exam  Review of Systems  Weight 220 lb (99.8 kg).There is no height or weight on file to calculate BMI.  General Appearance: NA  Eye Contact:  NA  Speech:  Normal Rate  Volume:  Decreased  Mood:   Euthymic  Affect:  NA  Thought Process:  Goal Directed  Orientation:  Full (Time, Place, and Person)  Thought Content:  WDL  Suicidal Thoughts:  No  Homicidal Thoughts:  No  Memory:  Immediate;   Good Recent;   Good Remote;   Fair  Judgement:  Intact  Insight:  Present  Psychomotor Activity:  NA  Concentration:  Concentration: Fair and Attention Span: Fair  Recall:  [employer] of Knowledge:  Fair  Language:  Fair  Akathisia:  No  Handed:  Right  AIMS (if indicated):     Assets:  Communication Skills Desire for Improvement Housing Transportation  ADL's:  Intact  Cognition:  WNL  Sleep:   ok      Assessment and Plan: Schizophrenia chronic paranoid type.  Discussed weight gain and recommended to watch his calorie intake.  He also started [employer] and trying to go every day to lose weight.  I recommend we can try reducing from the Risperdal to 1 mg since patient is much stable however reminded if he noticed symptoms coming back then he will call us immediately.  Patient agrees with the plan.  He used to take 4 mg but dose has been gradually reduced.  I also reminded to have his blood work results faxed to [name].  Recommended to call us back if there is any question or any concern.  Follow-up in 6 months.  Follow Up Instructions:  I discussed the assessment and treatment plan with the patient. The patient was provided an opportunity to ask questions and all were answered. The patient agreed with the plan and demonstrated an understanding of the instructions.   The patient was advised to call back or seek an in-person evaluation if the symptoms worsen or if the condition fails to improve as anticipated.  Collaboration of Care: Primary Care Provider AEB notes are available in epic to review.  Patient/Guardian was advised Release of Information must be obtained prior to any record release in order to collaborate their care with an outside provider. Patient/Guardian was  advised if they have not already done so to contact the registration department to sign all necessary forms in order for [name] to release information regarding their care.   Consent: Patient/Guardian gives verbal consent for treatment and assignment of benefits for services provided during this visit. Patient/Guardian expressed understanding and agreed to proceed.    I provided 20 minutes of non-face-to-face time during this encounter.   Cleotis Nipper, MD

## 2021-08-31 DIAGNOSIS — H35033 Hypertensive retinopathy, bilateral: Secondary | ICD-10-CM | POA: Diagnosis not present

## 2021-10-20 DIAGNOSIS — Z23 Encounter for immunization: Secondary | ICD-10-CM | POA: Diagnosis not present

## 2021-10-30 IMAGING — RF DG C-ARM 1-60 MIN-NO REPORT
1 series · 5 of 5 positions shown · non-contrast
Comparison: None.

CLINICAL DATA: 64-year-old male undergoing left anterior hip
replacement

EXAM:
OPERATIVE LEFT HIP (WITH PELVIS IF PERFORMED) single VIEWS
TECHNIQUE: Fluoroscopic spot image(s) were submitted for interpretation
post-operatively.

[Series 1: unknown protocol · 0.20mm/px · 5 of 5 slices shown]
[im 1/5]
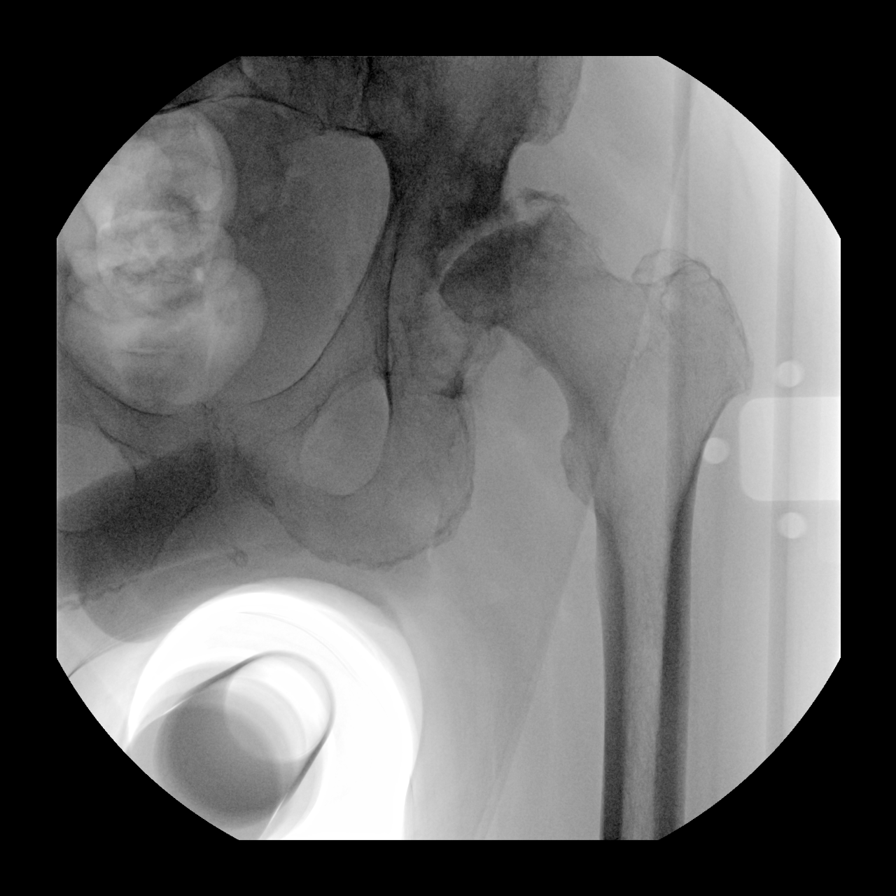
[im 2/5]
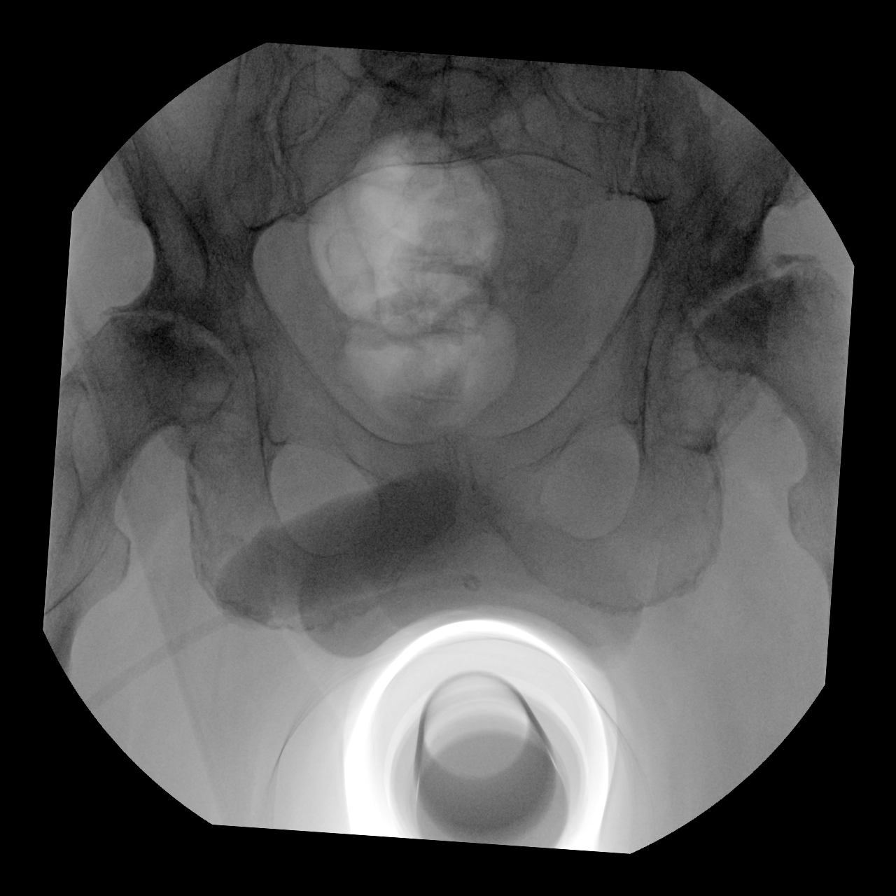
[im 3/5]
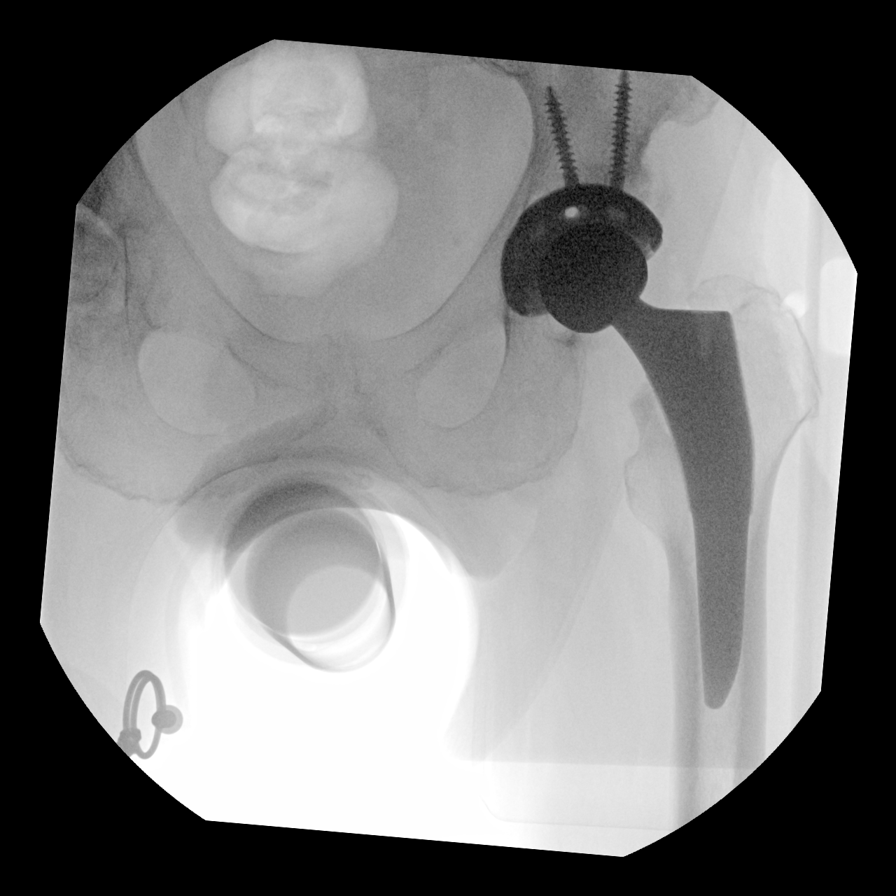
[im 4/5]
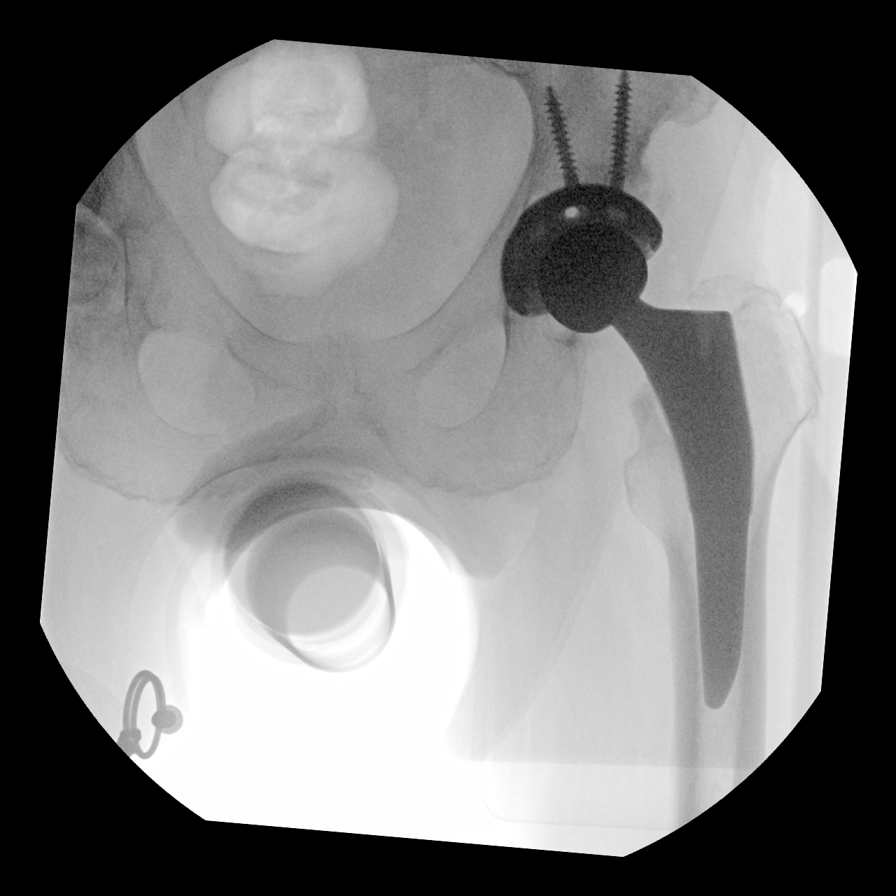
[im 5/5]
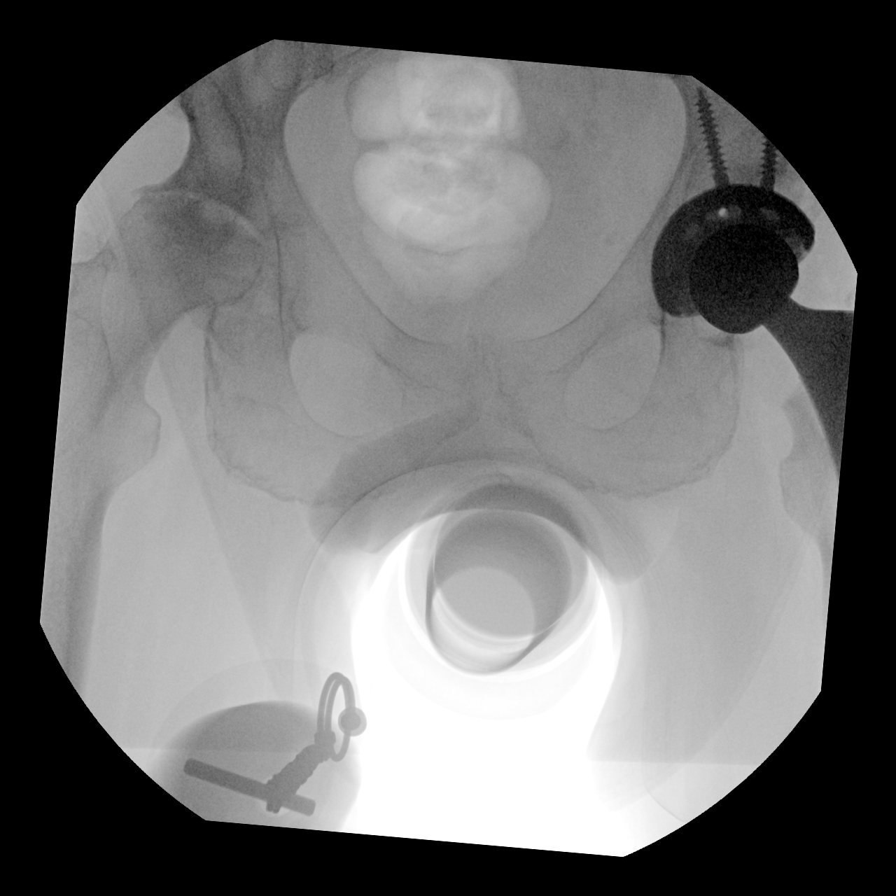

[5 of 5 positions shown; findings below may reference images not displayed]

FINDINGS: Initial images demonstrate severe left hip joint osteoarthritis with
suspected underlying avascular necrosis of the femoral head. There
is superior subluxation with pseudo acetabulum formation.

Subsequent images demonstrate interval changes of total hip
arthroplasty. No evidence of immediate hardware complication.
IMPRESSION: Left total hip arthroplasty without evidence of immediate hardware
complication.

## 2021-10-30 IMAGING — RF DG HIP (WITH PELVIS) OPERATIVE*L*
1 series · 5 of 5 positions shown · non-contrast
Comparison: None.

CLINICAL DATA: 64-year-old male undergoing left anterior hip
replacement

EXAM:
OPERATIVE LEFT HIP (WITH PELVIS IF PERFORMED) single VIEWS
TECHNIQUE: Fluoroscopic spot image(s) were submitted for interpretation
post-operatively.

[Series 1: unknown protocol · 0.20mm/px · 5 of 5 slices shown]
[im 1/5]
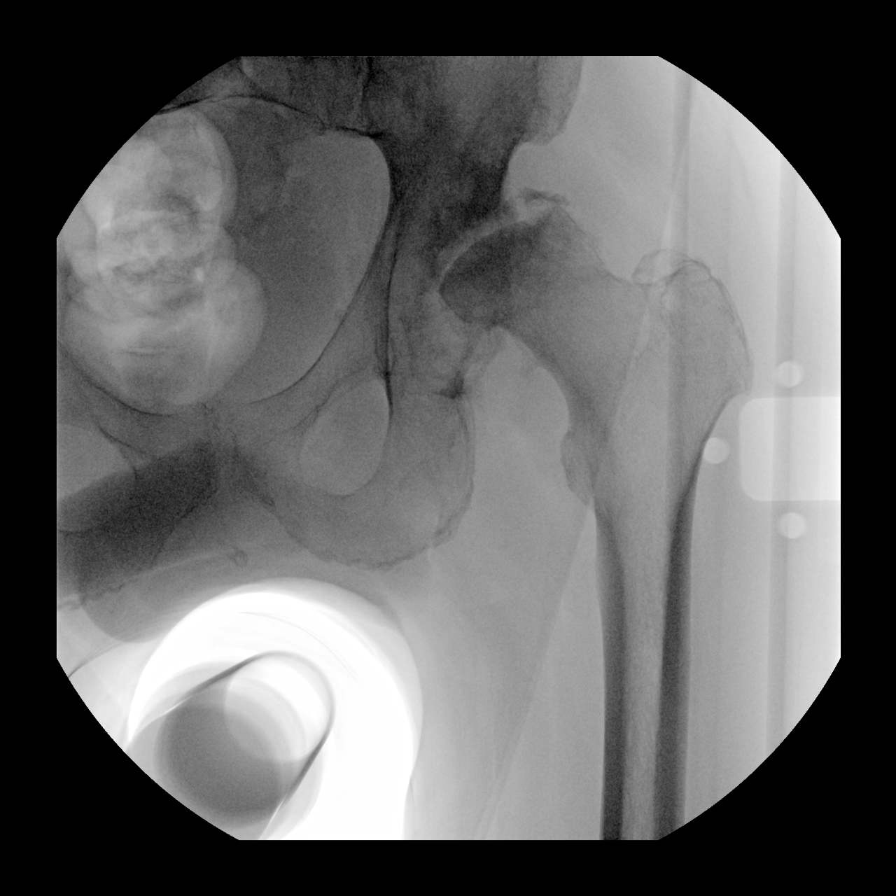
[im 2/5]
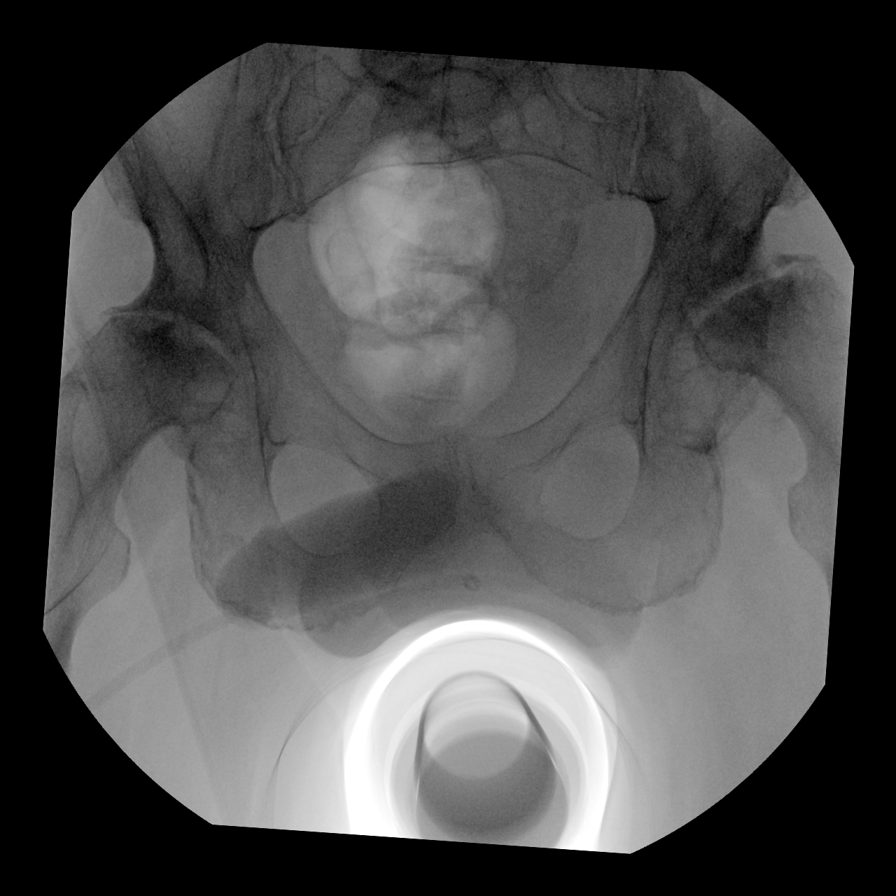
[im 3/5]
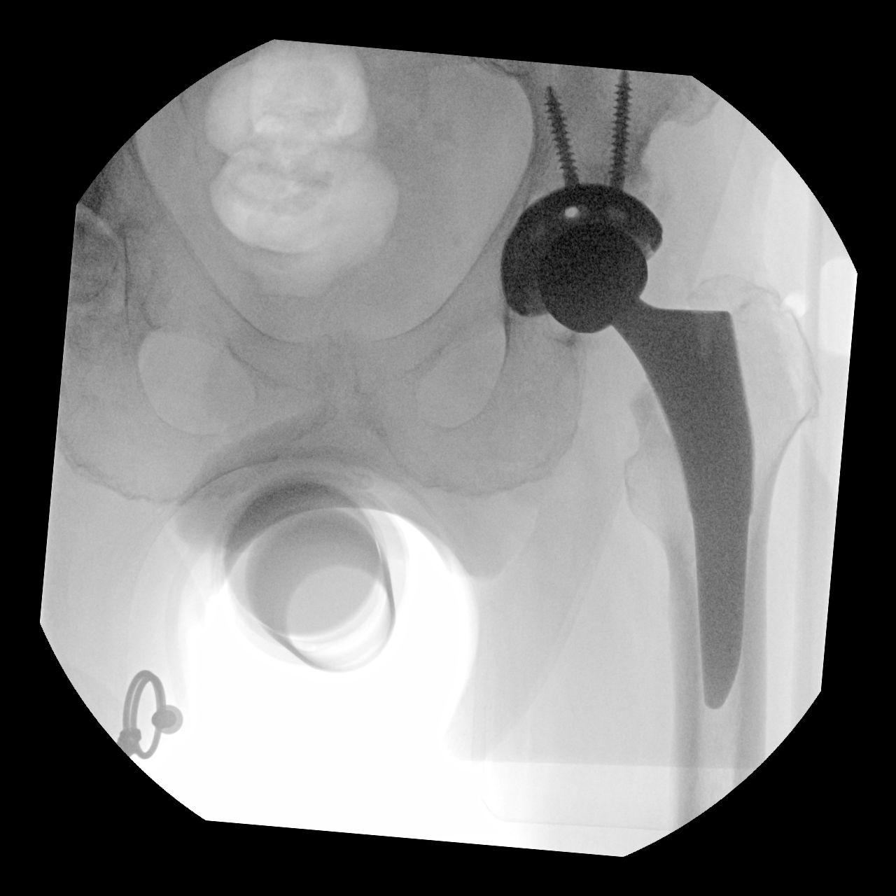
[im 4/5]
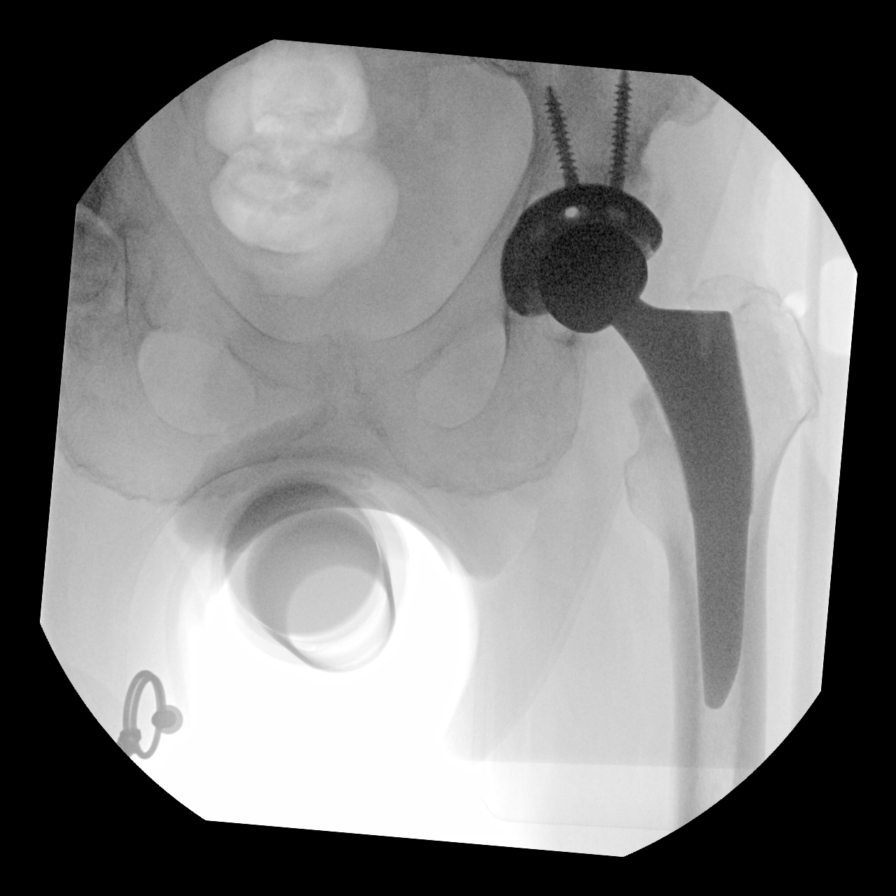
[im 5/5]
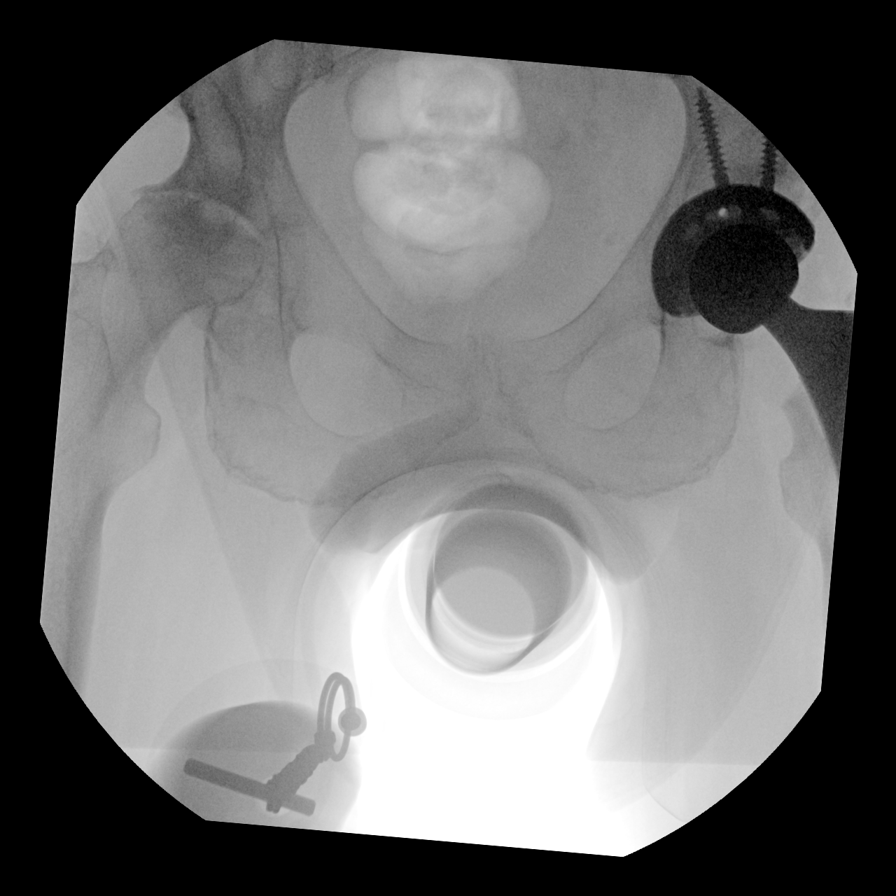

[5 of 5 positions shown; findings below may reference images not displayed]

FINDINGS: Initial images demonstrate severe left hip joint osteoarthritis with
suspected underlying avascular necrosis of the femoral head. There
is superior subluxation with pseudo acetabulum formation.

Subsequent images demonstrate interval changes of total hip
arthroplasty. No evidence of immediate hardware complication.
IMPRESSION: Left total hip arthroplasty without evidence of immediate hardware
complication.

## 2021-12-21 DIAGNOSIS — I10 Essential (primary) hypertension: Secondary | ICD-10-CM | POA: Diagnosis not present

## 2021-12-21 DIAGNOSIS — Z23 Encounter for immunization: Secondary | ICD-10-CM | POA: Diagnosis not present

## 2021-12-21 DIAGNOSIS — F200 Paranoid schizophrenia: Secondary | ICD-10-CM | POA: Diagnosis not present

## 2021-12-21 DIAGNOSIS — Z0000 Encounter for general adult medical examination without abnormal findings: Secondary | ICD-10-CM | POA: Diagnosis not present

## 2022-02-03 ENCOUNTER — Encounter (HOSPITAL_COMMUNITY): Payer: Self-pay | Admitting: Psychiatry

## 2022-02-03 ENCOUNTER — Telehealth (HOSPITAL_COMMUNITY): Payer: Medicare Other | Admitting: Psychiatry

## 2022-02-03 ENCOUNTER — Ambulatory Visit (HOSPITAL_BASED_OUTPATIENT_CLINIC_OR_DEPARTMENT_OTHER): Payer: Medicare Other | Admitting: Psychiatry

## 2022-02-03 DIAGNOSIS — F200 Paranoid schizophrenia: Secondary | ICD-10-CM | POA: Diagnosis not present

## 2022-02-03 MED ORDER — RISPERIDONE 1 MG PO TABS
ORAL_TABLET | ORAL | 1 refills | Status: DC
Start: 2022-02-03 — End: 2022-07-28

## 2022-02-03 NOTE — Progress Notes (Signed)
BH MD/PA/NP OP Progress Note  02/03/2022 1:15 PM Patrick Moreno  MRN:  [id number]  Chief Complaint:  Chief Complaint  Patient presents with   Follow-up   HPI: Patient came in for his follow-up appointment.  He forgot to cut down the dose of Risperdal and is still taking 2 mg.  He has been doing very well.  He admitted excessive weight gain because he is not active.  He stopped [employer] recently but like to go back again.  Recently had a blood work at his PCP office on the last week of November.  His hemoglobin A1c is 6.6.  His labs otherwise normal.  His BUN and creatinine is normal.  He reported his symptoms are stable and he denies any paranoia, hallucination, mania.  He denies any suicidal thoughts.  He has no tremors.  He had a good Christmas.  He was able to see his daughter who lives in [place].  He told his brother has surgery and he was taking care of him.  He has a nephew who lives in [place].  Patient had good support from his siblings and family members.  Patient denies any panic attack, anger, severe mood swings.  He lives by himself.  He denies drinking or using any illegal substances.   Visit Diagnosis:    ICD-10-CM   1. Schizophrenia, paranoid ([place])  F20.0 risperiDONE (RISPERDAL) 1 MG tablet       Past Psychiatric History:  H/O schizophrenia since 68.  H/O psychotic episode while working in [place].  No h/o inpatient or suicidal attempt. H/O taking Stelazine.  Took Risperdal up to 4 mg which was gradually reduced over the time.  Past Medical History:  Past Medical History:  Diagnosis Date   Arthritis 08/24/2016   In Left Hip   HTN (hypertension)    HTN (hypertension)    Hyperlipemia    Schizophrenia ([place])     Past Surgical History:  Procedure Laterality Date   CYSTOSCOPY  10/30/2019   Procedure: FLEXIBLE CYSTOSCOPY WITH FOLEY INSERTION;  Surgeon: Rod Can, MD;  Location: [facility];  Service: Orthopedics;;   TOTAL HIP ARTHROPLASTY Left 10/30/2019    Procedure: TOTAL HIP ARTHROPLASTY ANTERIOR APPROACH;  Surgeon: Rod Can, MD;  Location: [facility];  Service: Orthopedics;  Laterality: Left;   TOTAL HIP ARTHROPLASTY Right 02/19/2020   Procedure: TOTAL HIP ARTHROPLASTY ANTERIOR APPROACH;  Surgeon: Rod Can, MD;  Location: [facility];  Service: Orthopedics;  Laterality: Right;    Family Psychiatric History: Reviewed.  Family History: No family history on file.  Social History:  Social History   Socioeconomic History   Marital status: Single    Spouse name: Not on file   Number of children: Not on file   Years of education: Not on file   Highest education level: Not on file  Occupational History   Not on file  Tobacco Use   Smoking status: Never   Smokeless tobacco: Never  Vaping Use   Vaping Use: Never used  Substance and Sexual Activity   Alcohol use: Yes    Alcohol/week: 4.0 standard drinks of alcohol    Types: 4 Cans of beer per week    Comment: occas.   Drug use: No   Sexual activity: Not Currently  Other Topics Concern   Not on file  Social History Narrative   Not on file   Social Determinants of Health   Financial Resource Strain: Not on file  Food Insecurity: Not on file  Transportation Needs: Not  on file  Physical Activity: Not on file  Stress: Not on file  Social Connections: Not on file    Allergies: No Known Allergies  Metabolic Disorder Labs: No results found for: "HGBA1C", "MPG" No results found for: "PROLACTIN" No results found for: "CHOL", "TRIG", "HDL", "CHOLHDL", "VLDL", "LDLCALC" No results found for: "TSH"  Therapeutic Level Labs: No results found for: "LITHIUM" No results found for: "VALPROATE" No results found for: "CBMZ"  Current Medications: Current Outpatient Medications  Medication Sig Dispense Refill   metoprolol succinate (TOPROL-XL) 100 MG 24 hr tablet Take 100 mg by mouth daily.     hydrochlorothiazide (HYDRODIURIL) 12.5 MG tablet Take 12.5 mg by mouth daily.      olmesartan (BENICAR) 40 MG tablet 40 mg.     risperiDONE (RISPERDAL) 1 MG tablet Take one tab at bed time. 90 tablet 1   No current facility-administered medications for this visit.     Musculoskeletal: Strength & Muscle Tone: within normal limits Gait & Station: normal Patient leans: N/A  Psychiatric Specialty Exam: Review of Systems  Blood pressure (!) 177/84, pulse 76, temperature 97.8 F (36.6 C), weight 230 lb (104.3 kg).Body mass index is 36.02 kg/m.  General Appearance: Fairly Groomed  Eye Contact:  Fair  Speech:  Normal Rate  Volume:  Normal  Mood:  Euthymic  Affect:  Congruent  Thought Process:  Descriptions of Associations: Intact  Orientation:  Full (Time, Place, and Person)  Thought Content: Logical   Suicidal Thoughts:  No  Homicidal Thoughts:  No  Memory:  Immediate;   Good Recent;   Fair Remote;   Fair  Judgement:  Fair  Insight:  Present  Psychomotor Activity:  Normal  Concentration:  Concentration: Fair and Attention Span: Fair  Recall:  [employer] of Knowledge: Fair  Language: Fair  Akathisia:  No  Handed:  Right  AIMS (if indicated): not done  Assets:  Communication Skills Desire for Improvement Housing Transportation  ADL's:  Intact  Cognition: WNL  Sleep:  Good   Screenings: Flowsheet Row Video Visit from 08/04/2020 in [facility] Video Visit from 05/04/2020 in [place] No Risk No Risk        Assessment and Plan: Schizophrenia chronic paranoid type.  I reviewed blood work results.  His hemoglobin A1c is 6.6 which is slightly increased from the past.  Discuss weight gain and also reminded that we have reduced her dose 1 mg but patient has not picked up the new refills.  He agreed to try 1 mg Risperdal and agreed to call us back if he noticed symptoms worsening.  He also has plan to go back to start [employer].  He is also not happy  with his moderate weight gain.  Discussed medication side effects and benefits.  He used to take Risperdal 4 mg but dose has been gradually increased.  Recommended to call us back if is any question or any concern.  Follow-up in 3 months.  Collaboration of Care: Collaboration of Care: Other provider involved in patient's care AEB notes are available in epic to review.  Patient/Guardian was advised Release of Information must be obtained prior to any record release in order to collaborate their care with an outside provider. Patient/Guardian was advised if they have not already done so to contact the registration department to sign all necessary forms in order for [name] to release information regarding their care.   Consent: Patient/Guardian gives verbal  consent for treatment and assignment of benefits for services provided during this visit. Patient/Guardian expressed understanding and agreed to proceed.    Kathlee Nations, MD 02/03/2022, 1:15 PM

## 2022-02-19 IMAGING — RF DG HIP (WITH PELVIS) OPERATIVE*R*
1 series · 3 of 3 positions shown · non-contrast
Comparison: None.

CLINICAL DATA: Anterior hip arthroplasty.

EXAM:
OPERATIVE RIGHT HIP (WITH PELVIS IF PERFORMED) 3 VIEWS
TECHNIQUE: Fluoroscopic spot image(s) were submitted for interpretation
post-operatively.

[Series 1: unknown protocol · 0.20mm/px · 3 of 3 slices shown]
[im 1/3]
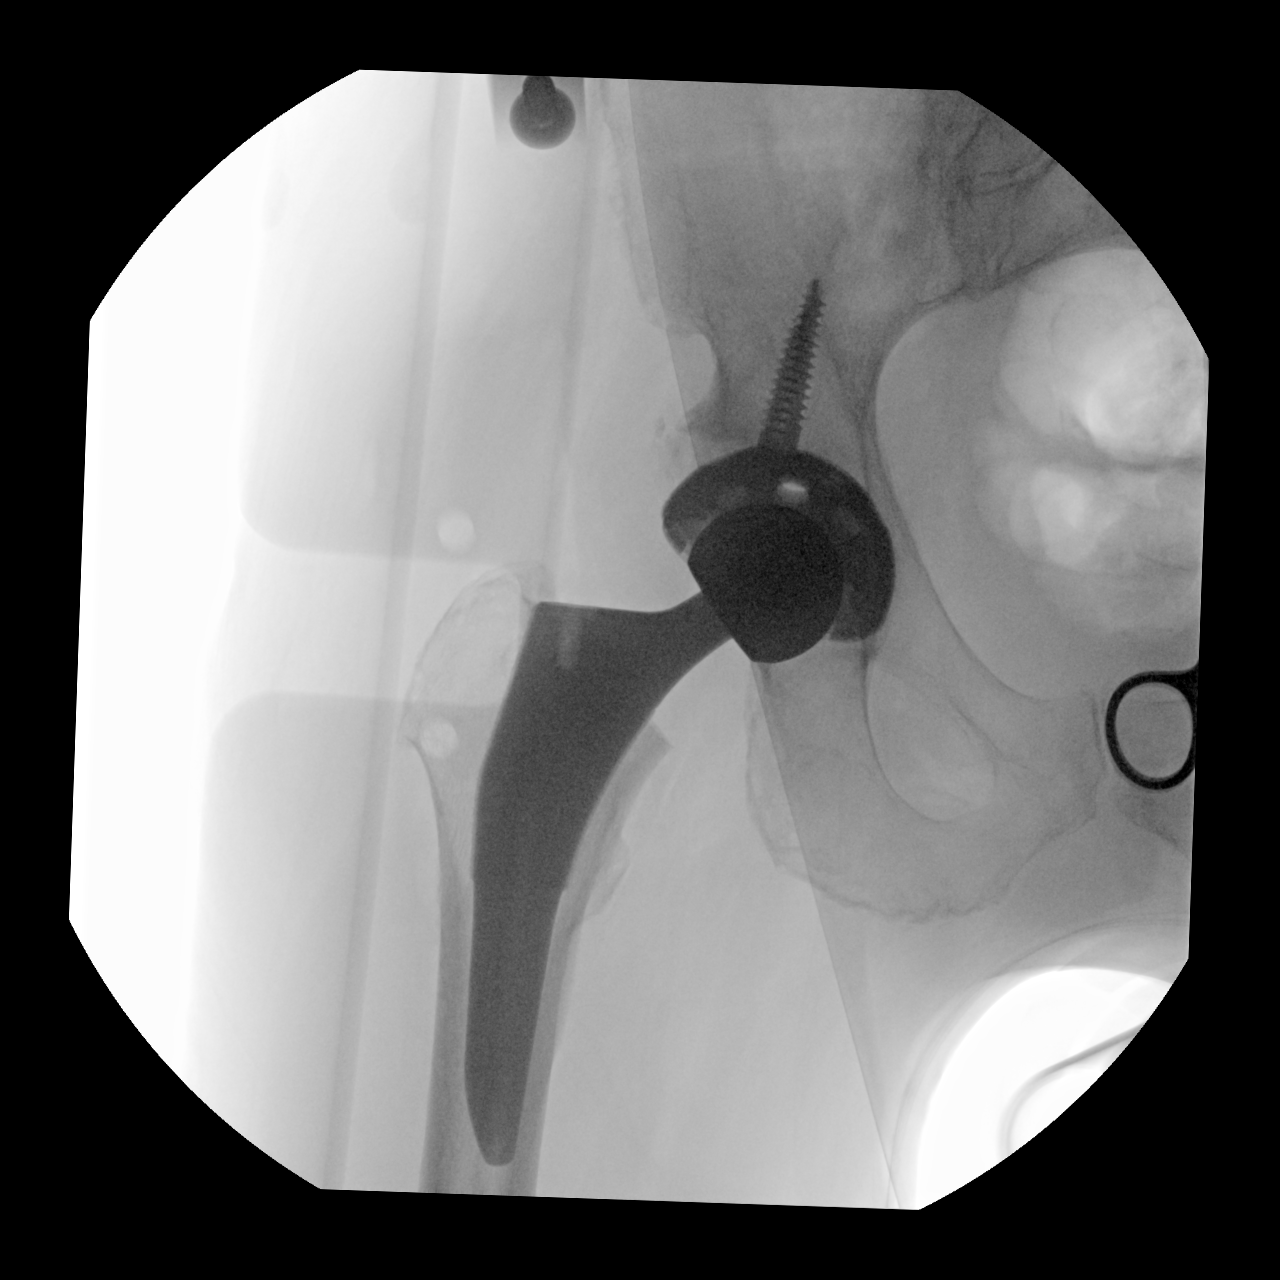
[im 2/3]
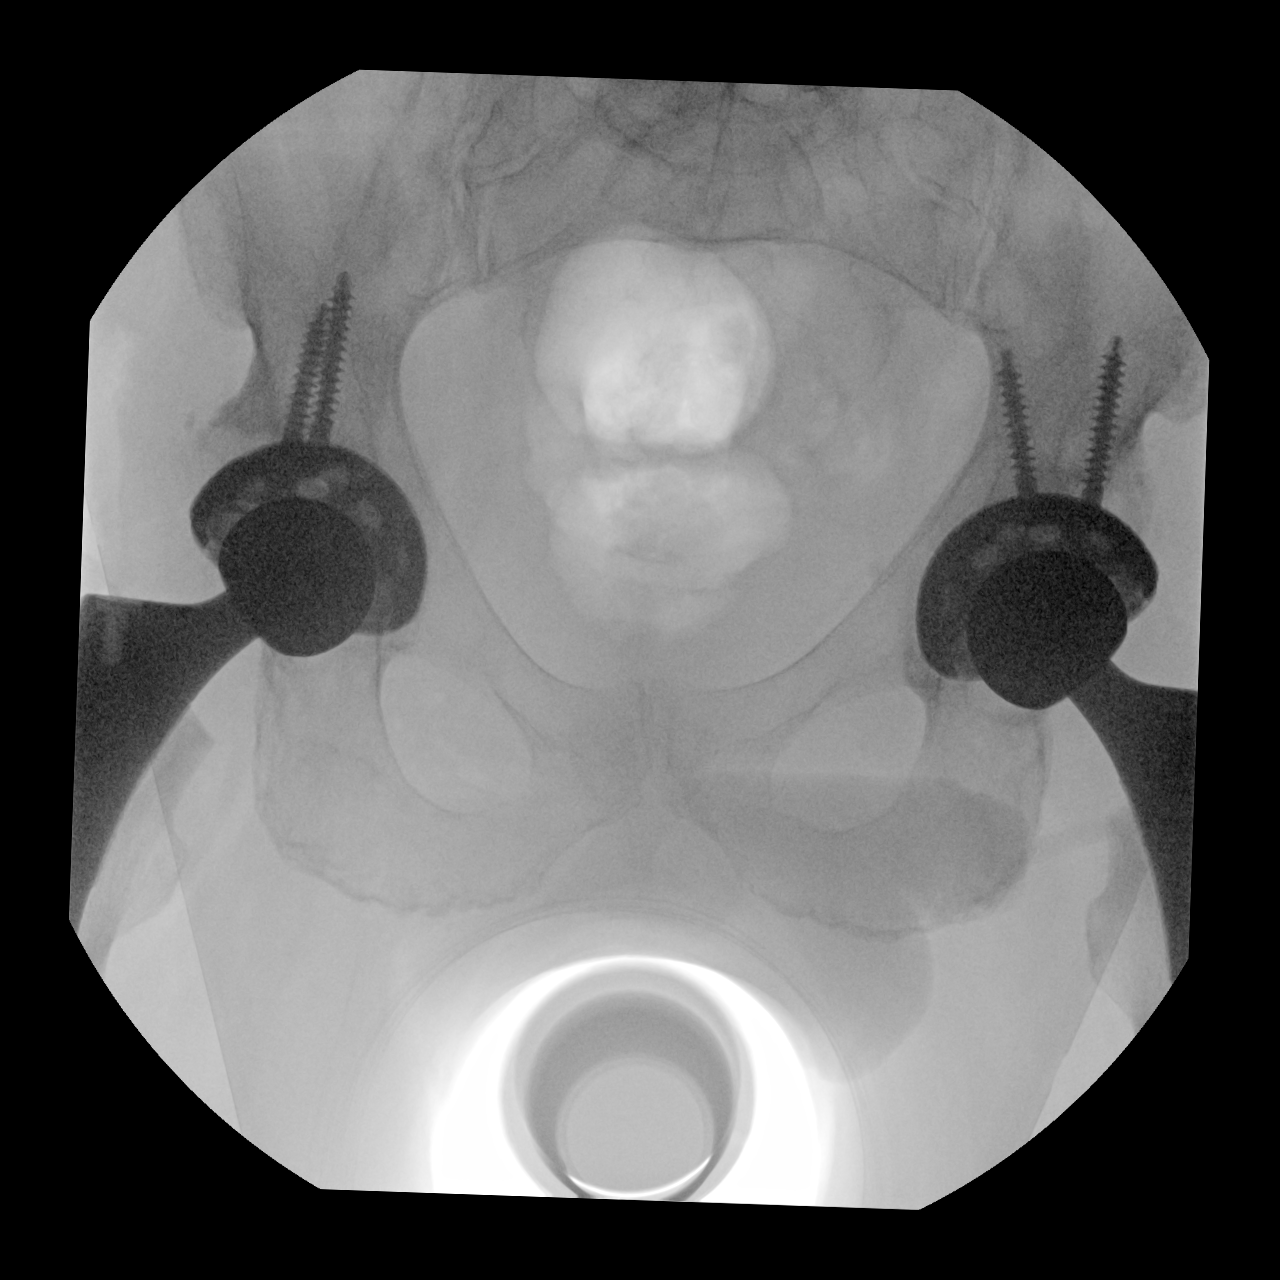
[im 3/3]
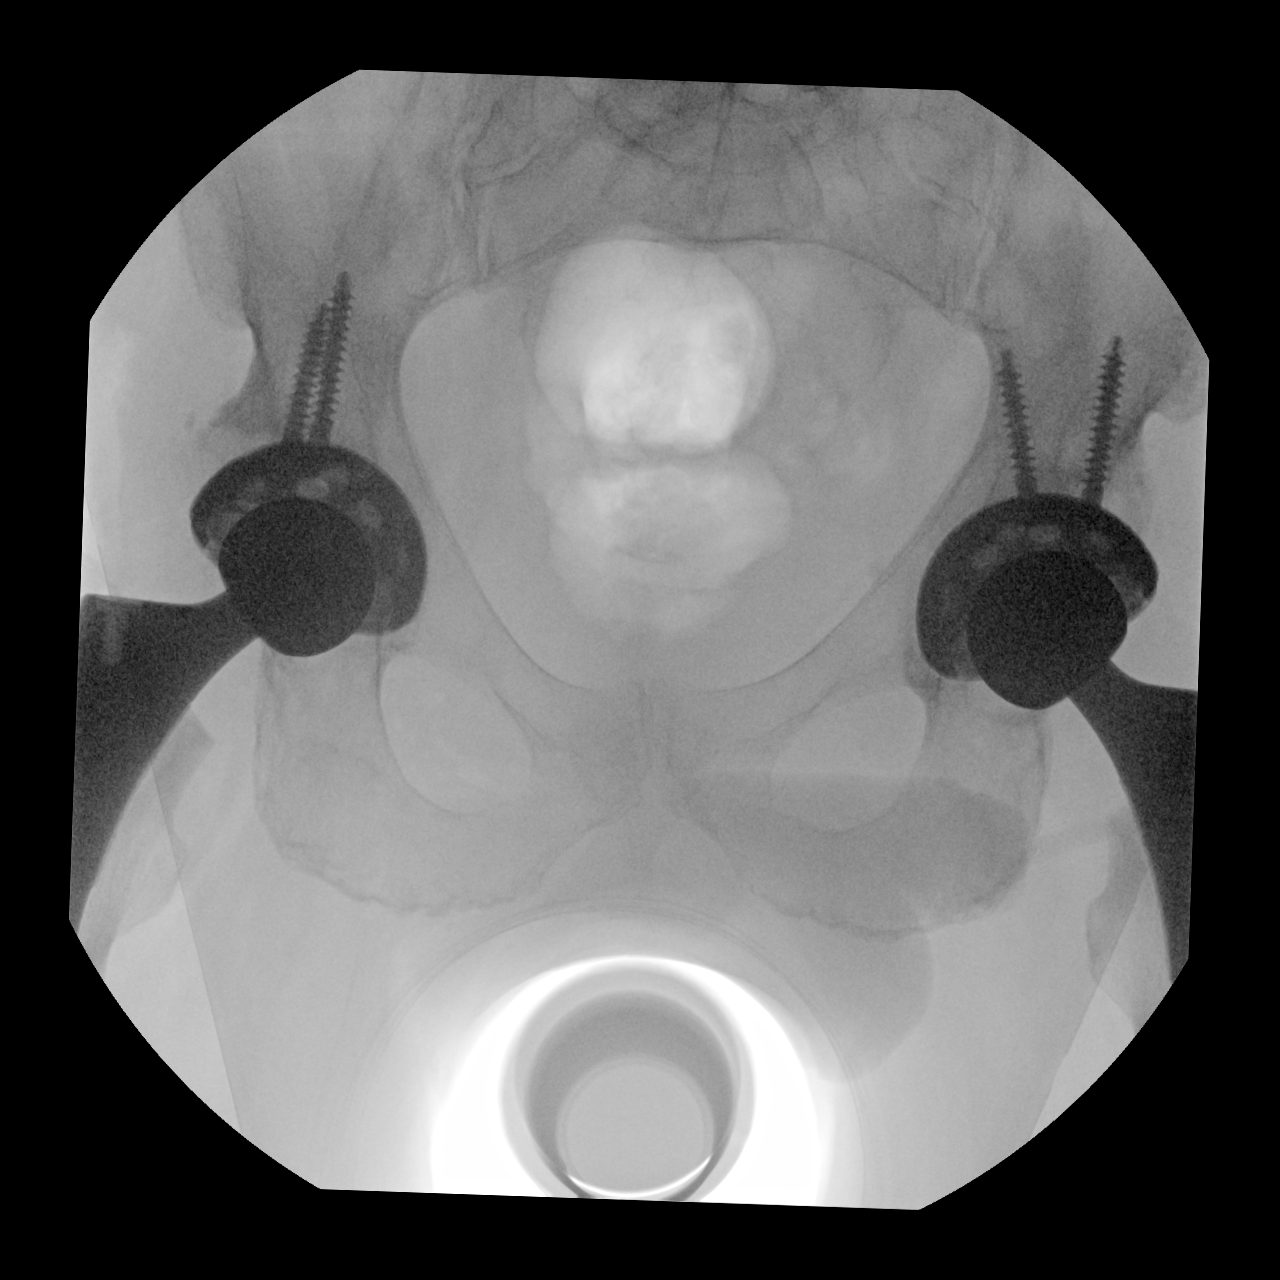

[3 of 3 positions shown; findings below may reference images not displayed]

FINDINGS: Fluoro time: 24 seconds.

Three C-arm fluoroscopic images were obtained intraoperatively and
submitted for post operative interpretation. These images
demonstrate postsurgical changes of right total hip arthroplasty
without any unexpected findings. Partially imaged prior left total
hip arthroplasty. Please see the performing provider's procedural
report for further detail.
IMPRESSION: Intraoperative fluoroscopic imaging, as detailed above.

## 2022-07-24 ENCOUNTER — Other Ambulatory Visit (HOSPITAL_COMMUNITY): Payer: Self-pay | Admitting: Psychiatry

## 2022-07-24 DIAGNOSIS — F200 Paranoid schizophrenia: Secondary | ICD-10-CM

## 2022-07-28 ENCOUNTER — Ambulatory Visit (HOSPITAL_BASED_OUTPATIENT_CLINIC_OR_DEPARTMENT_OTHER): Payer: Medicare Other | Admitting: Psychiatry

## 2022-07-28 ENCOUNTER — Encounter (HOSPITAL_COMMUNITY): Payer: Self-pay | Admitting: Psychiatry

## 2022-07-28 DIAGNOSIS — F200 Paranoid schizophrenia: Secondary | ICD-10-CM

## 2022-07-28 MED ORDER — RISPERIDONE 1 MG PO TABS: ORAL_TABLET | ORAL | 1 refills | Status: DC

## 2022-07-28 NOTE — Progress Notes (Signed)
BH MD/PA/NP OP Progress Note  07/28/2022 12:00 PM Patrick Moreno  MRN:  [id number]  Chief Complaint:  Chief Complaint  Patient presents with   Follow-up   HPI: Patient came in for his follow-up appointment.  He is doing very well on risperidone 1 mg at bedtime.  He denies any hallucination, paranoia, suicidal thoughts.  He has a blood work in November 2023.  His labs are stable.  Patient used to take 4 mg risperidone but slowly and gradually his dose has been reduced.  He feels good.  His energy level is okay.  He like to keep his current medication.  He has no tremors, shakes or any EPS.  He denies any suicidal thoughts or homicidal thoughts.  His appetite is okay.  He is close to his nephew who lives in [place].  Patient lives by himself and denies drinking or using any illegal substances.  Visit Diagnosis:    ICD-10-CM   1. Schizophrenia, paranoid ([facility])  F20.0 risperiDONE (RISPERDAL) 1 MG tablet       Past Psychiatric History:  H/O schizophrenia since 44.  H/O psychotic episode while working in [place].  No h/o inpatient or suicidal attempt. H/O taking Stelazine.  Took Risperdal up to 4 mg which was gradually reduced over the time.  Past Medical History:  Past Medical History:  Diagnosis Date   Arthritis 08/24/2016   In Left Hip   HTN (hypertension)    HTN (hypertension)    Hyperlipemia    Schizophrenia ([facility])     Past Surgical History:  Procedure Laterality Date   CYSTOSCOPY  10/30/2019   Procedure: FLEXIBLE CYSTOSCOPY WITH FOLEY INSERTION;  Surgeon: Samson Frederic, MD;  [facility];  Service: Orthopedics;;   TOTAL HIP ARTHROPLASTY Left 10/30/2019   Procedure: TOTAL HIP ARTHROPLASTY ANTERIOR APPROACH;  Surgeon: Samson Frederic, MD;  Location: [facility];  Service: Orthopedics;  Laterality: Left;   TOTAL HIP ARTHROPLASTY Right 02/19/2020   Procedure: TOTAL HIP ARTHROPLASTY ANTERIOR APPROACH;  Surgeon: Samson Frederic, MD;  [facility];  Service: Orthopedics;   Laterality: Right;    Family Psychiatric History: Reviewed.  Family History: No family history on file.  Social History:  Social History   Socioeconomic History   Marital status: Single    Spouse name: Not on file   Number of children: Not on file   Years of education: Not on file   Highest education level: Not on file  Occupational History   Not on file  Tobacco Use   Smoking status: Never   Smokeless tobacco: Never  Vaping Use   Vaping Use: Never used  Substance and Sexual Activity   Alcohol use: Yes    Alcohol/week: 4.0 standard drinks of alcohol    Types: 4 Cans of beer per week    Comment: occas.   Drug use: No   Sexual activity: Not Currently  Other Topics Concern   Not on file  Social History Narrative   Not on file   Social Determinants of Health   Financial Resource Strain: Not on file  Food Insecurity: Not on file  Transportation Needs: Not on file  Physical Activity: Not on file  Stress: Not on file  Social Connections: Not on file    Allergies: No Known Allergies  Metabolic Disorder Labs: No results found for: "HGBA1C", "MPG" No results found for: "PROLACTIN" No results found for: "CHOL", "TRIG", "HDL", "CHOLHDL", "VLDL", "LDLCALC" No results found for: "TSH"  Therapeutic Level Labs: No results found for: "LITHIUM" No  results found for: "VALPROATE" No results found for: "CBMZ"  Current Medications: Current Outpatient Medications  Medication Sig Dispense Refill   hydrochlorothiazide (HYDRODIURIL) 12.5 MG tablet Take 12.5 mg by mouth daily.     metoprolol succinate (TOPROL-XL) 100 MG 24 hr tablet Take 100 mg by mouth daily.     olmesartan (BENICAR) 40 MG tablet 40 mg.     risperiDONE (RISPERDAL) 1 MG tablet Take one tab at bed time. 90 tablet 1   No current facility-administered medications for this visit.     Musculoskeletal: Strength & Muscle Tone: within normal limits Gait & Station: normal Patient leans: N/A  Psychiatric  Specialty Exam: Review of Systems  Psychiatric/Behavioral:  Positive for decreased concentration. The patient is nervous/anxious.     Blood pressure (!) 145/91, pulse 79, height 5' 7.2" (1.707 m), weight 227 lb (103 kg).There is no height or weight on file to calculate BMI.  General Appearance: Fairly Groomed  Eye Contact:  Fair  Speech:  Normal Rate  Volume:  Normal  Mood:  Euthymic  Affect:  Congruent  Thought Process:  Descriptions of Associations: Intact  Orientation:  Full (Time, Place, and Person)  Thought Content: Logical   Suicidal Thoughts:  No  Homicidal Thoughts:  No  Memory:  Immediate;   Good Recent;   Fair Remote;   Fair  Judgement:  Fair  Insight:  Present  Psychomotor Activity:  Normal  Concentration:  Concentration: Fair and Attention Span: Fair  Recall:  [employer] of Knowledge: Fair  Language: Fair  Akathisia:  No  Handed:  Right  AIMS (if indicated): not done  Assets:  Communication Skills Desire for Improvement Housing Transportation  ADL's:  Intact  Cognition: WNL  Sleep:  Good   Screenings: Flowsheet Row Video Visit [facility]/04[facility]2020 [facility] Video Visit from 05/04/2020 in [facility]  C-SSRS RISK CATEGORY No Risk No Risk        Assessment and Plan: Schizophrenia chronic paranoid type.  Patient is stable on his current medication.  Continue risperidone 1 mg at bedtime.  Recommend to call us back if is any question or any concern.  Follow-up in 6 months.  Patient used to take 4 mg but dose has been gradually decreased and patient so far tolerating very well and has no concerns from the current medication.  Follow-up in 6 months.  Collaboration of Care: Collaboration of Care: Other provider involved in patient's care AEB notes are available in epic to review.  Patient/Guardian was advised Release of Information must be obtained prior to any record release in order to  collaborate their care with an outside provider. Patient/Guardian was advised if they have not already done so to contact the registration department to sign all necessary forms in order for [name] to release information regarding their care.   Consent: Patient/Guardian gives verbal consent for treatment and assignment of benefits for services provided during this visit. Patient/Guardian expressed understanding and agreed to proceed.    Cleotis Nipper, MD 07/28/2022, 12:00 PM

## 2023-01-19 ENCOUNTER — Ambulatory Visit (HOSPITAL_COMMUNITY): Payer: Medicare Other | Admitting: Psychiatry

## 2023-01-19 ENCOUNTER — Encounter (HOSPITAL_COMMUNITY): Payer: Self-pay | Admitting: Psychiatry

## 2023-01-19 ENCOUNTER — Other Ambulatory Visit (HOSPITAL_COMMUNITY): Payer: Self-pay | Admitting: Psychiatry

## 2023-01-19 DIAGNOSIS — F200 Paranoid schizophrenia: Secondary | ICD-10-CM

## 2023-01-19 MED ORDER — RISPERIDONE 1 MG PO TABS: ORAL_TABLET | ORAL | 1 refills | Status: DC

## 2023-01-19 NOTE — Progress Notes (Signed)
BH MD/PA/NP OP Progress Note  Patient location; office Provider location; office  01/19/2023 11:33 AM Patrick Moreno  MRN:  [id number]  Chief Complaint:  Chief Complaint  Patient presents with   Follow-up   HPI: Patient came for his follow-up appointment.  He is taking Risperdal 1 mg at bedtime.  He denies any hallucination, paranoia or any suicidal thoughts.  He lives by himself.  He appears somewhat restless and nervous but denies any panic attacks.  He had a good Thanksgiving with a family member.  He denies drinking or using any illegal substances.  His appetite is okay.  His weight is stable.  His primary care is Dr. Benedetto Goad who is retiring soon.  He need to find a new primary care.  He had blood work last month.  His RBC is 4.3, hemoglobin 13.9, total protein 6.3.  Sodium 132 and chloride 94.  His BUN 13 and creatinine 0.85.  No new medication added.  Denies drinking or using any illegal substances.  Visit Diagnosis:    ICD-10-CM   1. Schizophrenia, paranoid (HCC)  F20.0 risperiDONE (RISPERDAL) 1 MG tablet      Past Psychiatric History: Reviewed H/O schizophrenia since 43.  H/O psychotic episode while working in [place].  No h/o inpatient or suicidal attempt. H/O taking Stelazine.  Took Risperdal up to 4 mg which was gradually reduced over the time.   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  Social History:  Social History   Socioeconomic History   Marital status: Single    Spouse name: Not on file   Number of children: Not on file   Years of education: Not on file   Highest education level: Not on file  Occupational History   Not on file  Tobacco Use   Smoking status: Never   Smokeless tobacco: Never  Vaping Use   Vaping status: Never Used  Substance and Sexual Activity   Alcohol use: Yes    Alcohol/week: 4.0 standard drinks of alcohol    Types: 4 Cans of beer per week    Comment: occas.   Drug use: No   Sexual activity: Not Currently  Other Topics Concern   Not on file  Social History Narrative   Not on file   Social Drivers of Health   Financial Resource Strain: Not on file  Food Insecurity: Not on file  Transportation Needs: Not on file  Physical Activity: Not on file  Stress: Not on file  Social Connections: Not on file    Allergies: No Known Allergies  Metabolic Disorder Labs: No results found for: "HGBA1C", "MPG" No results found for: "PROLACTIN" No results found for: "CHOL", "TRIG", "  HDL", "CHOLHDL", "VLDL", "LDLCALC" No results found for: "TSH"  Therapeutic Level Labs: No results found for: "LITHIUM" No results found for: "VALPROATE" No results found for: "CBMZ"  Current Medications: Current Outpatient Medications  Medication Sig Dispense Refill   hydrochlorothiazide (HYDRODIURIL) 12.5 MG tablet Take 12.5 mg by mouth daily.     metoprolol succinate (TOPROL-XL) 100 MG 24 hr tablet Take 100 mg by mouth daily.     olmesartan (BENICAR) 40 MG tablet 40 mg.     risperiDONE (RISPERDAL) 1 MG tablet Take one tab at bed time. 90 tablet 1   No current facility-administered medications for this visit.      Musculoskeletal: Strength & Muscle Tone: within normal limits Gait & Station: normal Patient leans: N/A  Psychiatric Specialty Exam: Review of Systems  Blood pressure (!) 160/89, pulse 66, resp. rate 18, height 5\' 8"  (1.727 m), weight 210 lb 9.6 oz (95.5 kg).Body mass index is 32.02 kg/m.  General Appearance: Casual  Eye Contact:  Fair  Speech:  Slow  Volume:  Decreased  Mood:  Euthymic  Affect:  Congruent  Thought Process:  Descriptions of Associations: Intact  Orientation:  Full (Time, Place, and Person)  Thought Content: WDL   Suicidal Thoughts:  No  Homicidal Thoughts:  No  Memory:  Immediate;   Fair Recent;   Fair Remote;   Fair  Judgement:  Intact  Insight:  Present  Psychomotor Activity:   mild shakes  Concentration:  Concentration: Fair and Attention Span: Fair  Recall:  [employer] of Knowledge: Fair  Language: Fair  Akathisia:  No  Handed:  Right  AIMS (if indicated): not done  Assets:  Communication Skills Desire for Improvement Housing Transportation  ADL's:  Intact  Cognition: WNL  Sleep:  Good   Screenings: Flowsheet Row Video Visit [facility]04[facility]2020 [facility] Video Visit from 05/04/2020 in [facility]  C-SSRS RISK CATEGORY No Risk No Risk        Assessment and Plan: Patient is stable on current medication.  Though he appears somewhat anxious but denies any panic attack and he feel the current medicine is working.  He used to take 4 mg of Risperdal however gradually decreased and only taking 1 mg for past few years.  He has no other concerns from the medication.  I reviewed blood work results.  Discussed medication side effects and benefits.  Recommended to call us back if is any question or any concern.  Follow-up in 6 months.  Collaboration of Care: Collaboration of Care: Other provider involved in patient's care AEB notes are available in epic to  review  Patient/Guardian was advised Release of Information must be obtained prior to any record release in order to collaborate their care with an outside provider. Patient/Guardian was advised if they have not already done so to contact the registration department to sign all necessary forms in order for [name] to release information regarding their care.   Consent: Patient/Guardian gives verbal consent for treatment and assignment of benefits for services provided during this visit. Patient/Guardian expressed understanding and agreed to proceed.   I provided 18 minutes face-to-face time during this encounter.  Cleotis Nipper, MD 01/19/2023, 11:33 AM

## 2023-05-17 ENCOUNTER — Telehealth (HOSPITAL_COMMUNITY): Payer: Self-pay
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
## 2023-07-13 ENCOUNTER — Encounter (HOSPITAL_COMMUNITY): Payer: Self-pay | Admitting: Psychiatry

## 2023-07-13 ENCOUNTER — Other Ambulatory Visit: Payer: Self-pay

## 2023-07-13 ENCOUNTER — Ambulatory Visit (HOSPITAL_BASED_OUTPATIENT_CLINIC_OR_DEPARTMENT_OTHER): Payer: Medicare Other | Admitting: Psychiatry

## 2023-07-13 DIAGNOSIS — F200 Paranoid schizophrenia: Secondary | ICD-10-CM

## 2023-07-13 MED ORDER — RISPERIDONE 1 MG PO TABS: ORAL_TABLET | ORAL | 1 refills | Status: DC

## 2023-07-13 NOTE — Progress Notes (Signed)
 BH MD/PA/NP OP Progress Note  Patient location; office Provider location; office  07/13/2023 8:44 AM Patrick Moreno  MRN:  [id number]  Chief Complaint:  Chief Complaint  Patient presents with   Follow-up   Medication Refill   HPI: Patient came today for his follow-up appointment.  He is doing better.  He is started walking and watching his calorie intake and he lost 10 pounds since the last visit.  He also started [employer] but usually does not exercise at home.  He had bar belt and he does lift weight.  He is trying to be more focused on his general health.  He also find a new primary care at [facility] in [place].  He has appointment coming up in November.  His blood pressure is much better since he lost weight.  He denies any hallucination, paranoia, suicidal thoughts.  He is excited as brother coming from [place] to visit him.  Patient lives by himself.  He denies any irritability, anger, mania, suicidal thoughts or any hallucination.  He like to keep his current dose of risperidone  1 mg.  He is also on multiple medication for his blood pressure.  He has mild tremors and sometimes he clenches fingers but do not report that any major concern.  Like to keep his current medication.  Visit Diagnosis:    ICD-10-CM   1. Schizophrenia, paranoid (HCC)  F20.0 risperiDONE  (RISPERDAL ) 1 MG tablet       Past Psychiatric History: Reviewed H/O schizophrenia since 30.  H/O psychotic episode while working in [place] .  No h/o inpatient or suicidal attempt. H/O taking Stelazine.  Took Risperdal  up to 4 mg which was gradually reduced over the time.   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 No pertinent family history.  Social History:  Social History   Socioeconomic History   Marital status: Single    Spouse name: Not on file   Number of children: Not on file   Years of education: Not on file   Highest education level: Not on file  Occupational History   Not on file  Tobacco Use   Smoking status: Never   Smokeless tobacco: Never  Vaping Use   Vaping status: Never Used  Substance and Sexual Activity   Alcohol  use: Yes    Alcohol /week: 4.0 standard drinks of alcohol     Types: 4 Cans of beer per week    Comment: occas.   Drug use: No   Sexual activity: Not Currently  Other Topics Concern   Not on file  Social History Narrative   Not on file   Social Drivers of Health   Financial Resource Strain: Not on file  Food Insecurity:  Not on file  Transportation Needs: Not on file  Physical Activity: Not on file  Stress: Not on file  Social Connections: Not on file    Allergies: No Known Allergies  Metabolic Disorder Labs: No results found for: HGBA1C, MPG No results found for: PROLACTIN No results found for: CHOL, TRIG, HDL, CHOLHDL, VLDL, LDLCALC No results found for: TSH  Therapeutic Level Labs: No results found for: LITHIUM No results found for: VALPROATE No results found for: CBMZ  Current Medications: Current Outpatient Medications  Medication Sig Dispense Refill   hydrochlorothiazide (HYDRODIURIL) 12.5 MG tablet  Take 12.5 mg by mouth daily.     metoprolol succinate (TOPROL-XL) 100 MG 24 hr tablet Take 100 mg by mouth daily.     olmesartan (BENICAR) 40 MG tablet 40 mg.     prednisoLONE acetate (PRED FORTE) 1 % ophthalmic suspension INSTILL 1 DROP INTO RIGHT EYE 4 TIMES DAILY     risperiDONE  (RISPERDAL ) 1 MG tablet Take one tab at bed time. 90 tablet 1   No current facility-administered medications for this visit.     Musculoskeletal: Strength & Muscle Tone: within normal limits Gait & Station: normal Patient leans: N/A  Psychiatric Specialty Exam: Review of Systems  Psychiatric/Behavioral:  The patient is nervous/anxious.     Blood pressure 139/75, pulse 73, height 5' 8 (1.727 m), weight 201 lb (91.2 kg).Body mass index is 30.56 kg/m.  General Appearance: Casual  Eye Contact:  Fair  Speech:  Slow  Volume:  Decreased  Mood:  Euthymic  Affect:  Congruent  Thought Process:  Descriptions of Associations: Intact  Orientation:  Full (Time, Place, and Person)  Thought Content: WDL   Suicidal Thoughts:  No  Homicidal Thoughts:  No  Memory:  Immediate;   Fair Recent;   Fair Remote;   Fair  Judgement:  Intact  Insight:  Present  Psychomotor Activity:  sometimes rocking and mild shakes  Concentration:  Concentration: Fair and Attention Span: Fair  Recall:  [employer] of Knowledge: Fair  Language: Fair  Akathisia:  No  Handed:  Right  AIMS (if indicated): not done  Assets:  Communication Skills Desire for Improvement Housing Transportation  ADL's:  Intact  Cognition: WNL  Sleep:  Good   Screenings: Flowsheet Row Video Visit from 08/04/2020 in [facility] Video Visit from 05/04/2020 in [facility]  C-SSRS RISK CATEGORY No Risk No Risk     Assessment and Plan: Patient is doing better since he lost weight and more active.  His blood pressure is good.  Does not want to change the medication since it is  working well.  He has appointment coming up to see his new PCP in November.  He used to take risperidone  4 mg but gradually dose decreased to 1 mg.  He is sleeping okay.  Discussed medication side effects and benefits.  Recommend to call us  back with any question or any concern.  Follow-up in 6 months.  Collaboration of Care: Collaboration of Care: Other provider involved in patient's care AEB notes are available in epic to review  Patient/Guardian was advised Release of Information must be obtained prior to any record release in order to collaborate their care with an outside provider. Patient/Guardian was advised if they have not already done so to contact the registration department to sign all necessary forms in order for us  to release information regarding their care.   Consent: Patient/Guardian gives verbal consent for treatment and assignment  of benefits for services provided during this visit. Patient/Guardian expressed understanding and agreed to proceed.   I provided 20 minutes face-to-face time during this encounter.  Arturo Late, MD 07/13/2023, 8:44 AM

## 2023-07-20 ENCOUNTER — Ambulatory Visit (HOSPITAL_COMMUNITY): Payer: Medicare Other | Admitting: Psychiatry

## 2023-12-31 ENCOUNTER — Other Ambulatory Visit (HOSPITAL_COMMUNITY): Payer: Self-pay | Admitting: Psychiatry

## 2023-12-31 DIAGNOSIS — F200 Paranoid schizophrenia: Secondary | ICD-10-CM

## 2024-01-08 ENCOUNTER — Other Ambulatory Visit (HOSPITAL_COMMUNITY): Payer: Self-pay | Admitting: *Deleted

## 2024-01-08 DIAGNOSIS — F200 Paranoid schizophrenia: Secondary | ICD-10-CM

## 2024-01-08 MED ORDER — RISPERIDONE 1 MG PO TABS: ORAL_TABLET | ORAL | 0 refills | Status: DC

## 2024-01-14 ENCOUNTER — Other Ambulatory Visit (HOSPITAL_COMMUNITY): Payer: Self-pay | Admitting: Psychiatry

## 2024-01-14 DIAGNOSIS — F200 Paranoid schizophrenia: Secondary | ICD-10-CM

## 2024-01-18 ENCOUNTER — Other Ambulatory Visit: Payer: Self-pay

## 2024-01-18 ENCOUNTER — Ambulatory Visit (HOSPITAL_COMMUNITY): Admitting: Psychiatry

## 2024-01-18 ENCOUNTER — Encounter (HOSPITAL_COMMUNITY): Payer: Self-pay | Admitting: Psychiatry

## 2024-01-18 VITALS — BP 154/81 | HR 64 | Ht 68.0 in | Wt 207.0 lb

## 2024-01-18 DIAGNOSIS — F200 Paranoid schizophrenia: Secondary | ICD-10-CM | POA: Diagnosis not present

## 2024-01-18 MED ORDER — RISPERIDONE 1 MG PO TABS: ORAL_TABLET | ORAL | 1 refills | Status: AC

## 2024-01-18 NOTE — Progress Notes (Signed)
 BH MD/PA/NP OP Progress Note  Patient location; office Provider location; office  01/18/2024 8:29 AM Patrick Moreno  MRN:  [id number]  Chief Complaint:  Chief Complaint  Patient presents with   Follow-up   Medication Refill   HPI: Patient came to the office for his follow-up ointment.  He reported things are going okay.  He is spent Thanksgiving with his nephew and niece.  His brother lives in [place] but not sure if he will able to come in Christmas because he is busy with his own family.  Patient is taking risperidone  1 mg at bedtime which is helping his paranoia, hallucination.  He denies any irritability, anger, mania, psychosis.  He sleeps good.  He has mild tremors but does not interfere in daily activities.  Sometimes he clenches his finger.  He does not want to add any medication to help the tremors.  Recently had a visit with primary care and had blood work.  His cholesterol, LDL and triglycerides are slightly high.  He is not aware about it but he is going to talk to his PCP if anything he need to take.  He is taking multiple medicine for blood pressure.  Since cold weather started he is not as consistent going to [employer] but tried to walk near his neighborhood.  He denies any crying spells or any feeling of hopelessness or worthlessness.  He denies any suicidal thoughts.  Visit Diagnosis:    ICD-10-CM   1. Schizophrenia, paranoid (HCC)  F20.0 risperiDONE  (RISPERDAL ) 1 MG tablet        Past Psychiatric History: Reviewed H/O schizophrenia since 57.  H/O psychotic episode while working in [place] .  No h/o inpatient or suicidal attempt. H/O taking Stelazine.  Took Risperdal  up to 4 mg which was gradually reduced over the time.   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  Social History:  Social History   Socioeconomic History   Marital status: Single    Spouse name: Not on file   Number of children: Not on file   Years of education: Not on file   Highest education level: Not on file  Occupational History   Not on file  Tobacco Use   Smoking status: Never   Smokeless tobacco: Never  Vaping Use   Vaping status: Never Used  Substance and Sexual Activity   Alcohol  use: Yes    Alcohol /week: 4.0 standard drinks of alcohol     Types: 4 Cans of beer per week    Comment: occas.   Drug use: No   Sexual activity: Not Currently  Other Topics Concern   Not on file  Social History Narrative   Not on  file   Social Drivers of Health   Tobacco Use: Low Risk (01/16/2024)   Received from [facility]   Patient History    Smoking Tobacco Use: Never    Smokeless Tobacco Use: Never    Passive Exposure: Not on file  Financial Resource Strain: Not on file  Food Insecurity: Low Risk (01/16/2024)   Received from [facility]   Epic    Within the past 12 months, you worried that your food would run out before you got money to buy more: Never true    Within the past 12 months, the food you bought just didn't last and you didn't have money to get more. : Never true  Transportation Needs: No Transportation Needs (01/16/2024)   Received from [employer]    In the past 12 months, has lack of reliable transportation kept you from medical appointments, meetings, work or from getting things needed for daily living? : No  Physical Activity: Not on file  Stress: Not on file  Social Connections: Not on file  Depression (EYV7-0): Not on file  Alcohol  Screen: Not on file  Housing: Low Risk (01/16/2024)   Received from [facility]   Epic    What is your living situation today?: I have a steady place to live    Think about the place you live. Do you have problems with any of the following? Choose all that apply:: Not on file  Utilities: Low Risk (01/16/2024)   Received from [facility]   Utilities    In the past 12 months has the electric, gas, oil, or water  company threatened to shut off services in your home? : No  Health Literacy: Not on file    Allergies: No Known Allergies  Metabolic Disorder Labs: No results found for: HGBA1C, MPG No results found for: PROLACTIN No results found for: CHOL, TRIG, HDL, CHOLHDL, VLDL, LDLCALC No results found for: TSH  Therapeutic Level Labs: No results found for: LITHIUM No results found for: VALPROATE No results found for: CBMZ  Current Medications: Current Outpatient Medications  Medication Sig Dispense Refill   hydrochlorothiazide (HYDRODIURIL) 12.5 MG tablet Take 12.5 mg by mouth daily.     metoprolol succinate (TOPROL-XL) 100 MG 24 hr tablet Take 100 mg by mouth daily.     olmesartan (BENICAR) 40 MG tablet 40 mg.     risperiDONE  (RISPERDAL ) 1 MG tablet Take one tab at bed time. 10 tablet 0   prednisoLONE acetate (PRED FORTE) 1 % ophthalmic suspension INSTILL 1 DROP INTO RIGHT EYE 4 TIMES DAILY (Patient not taking: Reported on 01/18/2024)     No current facility-administered medications for this visit.     Musculoskeletal: Strength & Muscle Tone: within normal limits Gait & Station: normal Patient leans: N/A  Psychiatric Specialty  Exam: Review of Systems  Blood pressure (!) 154/81, pulse 64, height 5' 8 (1.727 m), weight 207 lb (93.9 kg).Body mass index is 31.47 kg/m.  General Appearance: Casual  Eye Contact:  Fair  Speech:  Slow  Volume:  Decreased  Mood:  Euthymic  Affect:  Congruent  Thought Process:  Descriptions of Associations: Intact  Orientation:  Full (Time, Place, and Person)  Thought Content: WDL   Suicidal Thoughts:  No  Homicidal Thoughts:  No  Memory:  Immediate;   Fair Recent;   Fair Remote;   Fair  Judgement:  Intact  Insight:  Present  Psychomotor Activity:  Tremor  Concentration:  Concentration: Fair and Attention Span:  Fair  Recall:  [employer] of Knowledge: Fair  Language: Fair  Akathisia:  No  Handed:  Right  AIMS (if indicated): not done  Assets:  Communication Skills Desire for Improvement Housing Transportation  ADL's:  Intact  Cognition: WNL  Sleep:  Good   Screenings: Flowsheet Row Video Visit from 08/04/2020 in [facility] Video Visit from 05/04/2020 in [facility]  C-SSRS RISK CATEGORY No Risk No Risk     Assessment and Plan: Patient is 68 year old man with history of schizophrenia chronic paranoid type.  Currently stable on low-dose risperidone .  He used to take 4 mg but dose has been gradually decreased.  So far tolerating medication.  Discussed mild tremors but patient does not want to take any other medication.  Reviewed blood work results.  He has high LDL and cholesterol.  Encouraged to contact primary care to go over with results.  Recommend to call back if is any question or any concern.  Will continue risperidone  1 mg at bedtime.  Follow-up in 6 months.  Collaboration of Care: Collaboration of Care: Other provider involved in patient's care AEB notes are available in epic to review  Patient/Guardian was advised Release of Information must be obtained prior to any record release in  order to collaborate their care with an outside provider. Patient/Guardian was advised if they have not already done so to contact the registration department to sign all necessary forms in order for us  to release information regarding their care.   Consent: Patient/Guardian gives verbal consent for treatment and assignment of benefits for services provided during this visit. Patient/Guardian expressed understanding and agreed to proceed.    Leni ONEIDA Client, MD 01/18/2024, 8:29 AM

## 2024-07-18 ENCOUNTER — Ambulatory Visit (HOSPITAL_COMMUNITY): Admitting: Psychiatry
